# Patient Record
Sex: Female | Born: 1950 | Race: White | Hispanic: No | Smoking: Former smoker
Health system: Southern US, Community
[De-identification: ages and names within clinical notes are randomized; demographics above are authoritative.]

## PROBLEM LIST (undated history)

## (undated) DIAGNOSIS — C801 Malignant (primary) neoplasm, unspecified: Secondary | ICD-10-CM

## (undated) DIAGNOSIS — N059 Unspecified nephritic syndrome with unspecified morphologic changes: Secondary | ICD-10-CM

## (undated) DIAGNOSIS — D863 Sarcoidosis of skin: Secondary | ICD-10-CM

## (undated) DIAGNOSIS — J449 Chronic obstructive pulmonary disease, unspecified: Secondary | ICD-10-CM

## (undated) DIAGNOSIS — K219 Gastro-esophageal reflux disease without esophagitis: Secondary | ICD-10-CM

## (undated) DIAGNOSIS — D869 Sarcoidosis, unspecified: Secondary | ICD-10-CM

## (undated) DIAGNOSIS — F329 Major depressive disorder, single episode, unspecified: Secondary | ICD-10-CM

## (undated) DIAGNOSIS — R42 Dizziness and giddiness: Secondary | ICD-10-CM

## (undated) DIAGNOSIS — Z9889 Other specified postprocedural states: Secondary | ICD-10-CM

## (undated) DIAGNOSIS — M797 Fibromyalgia: Secondary | ICD-10-CM

## (undated) DIAGNOSIS — T451X5A Adverse effect of antineoplastic and immunosuppressive drugs, initial encounter: Secondary | ICD-10-CM

## (undated) DIAGNOSIS — F32A Depression, unspecified: Secondary | ICD-10-CM

## (undated) DIAGNOSIS — G8929 Other chronic pain: Secondary | ICD-10-CM

## (undated) DIAGNOSIS — R112 Nausea with vomiting, unspecified: Secondary | ICD-10-CM

## (undated) DIAGNOSIS — F419 Anxiety disorder, unspecified: Secondary | ICD-10-CM

## (undated) DIAGNOSIS — J189 Pneumonia, unspecified organism: Secondary | ICD-10-CM

## (undated) DIAGNOSIS — T8859XA Other complications of anesthesia, initial encounter: Secondary | ICD-10-CM

## (undated) DIAGNOSIS — IMO0002 Reserved for concepts with insufficient information to code with codable children: Secondary | ICD-10-CM

## (undated) DIAGNOSIS — Z86718 Personal history of other venous thrombosis and embolism: Secondary | ICD-10-CM

## (undated) DIAGNOSIS — H00019 Hordeolum externum unspecified eye, unspecified eyelid: Secondary | ICD-10-CM

## (undated) DIAGNOSIS — R410 Disorientation, unspecified: Secondary | ICD-10-CM

## (undated) DIAGNOSIS — J40 Bronchitis, not specified as acute or chronic: Secondary | ICD-10-CM

## (undated) DIAGNOSIS — D649 Anemia, unspecified: Secondary | ICD-10-CM

## (undated) DIAGNOSIS — M1990 Unspecified osteoarthritis, unspecified site: Secondary | ICD-10-CM

## (undated) DIAGNOSIS — Z5189 Encounter for other specified aftercare: Secondary | ICD-10-CM

## (undated) DIAGNOSIS — R0602 Shortness of breath: Secondary | ICD-10-CM

## (undated) DIAGNOSIS — R51 Headache: Secondary | ICD-10-CM

## (undated) DIAGNOSIS — T4145XA Adverse effect of unspecified anesthetic, initial encounter: Secondary | ICD-10-CM

## (undated) DIAGNOSIS — M549 Dorsalgia, unspecified: Secondary | ICD-10-CM

## (undated) DIAGNOSIS — N189 Chronic kidney disease, unspecified: Secondary | ICD-10-CM

## (undated) HISTORY — DX: Unspecified nephritic syndrome with unspecified morphologic changes: N05.9

## (undated) HISTORY — DX: Malignant (primary) neoplasm, unspecified: C80.1

## (undated) HISTORY — DX: Chronic obstructive pulmonary disease, unspecified: J44.9

## (undated) HISTORY — DX: Sarcoidosis of skin: D86.3

## (undated) HISTORY — DX: Gastro-esophageal reflux disease without esophagitis: K21.9

## (undated) HISTORY — PX: BLADDER SUSPENSION: SHX72

## (undated) HISTORY — PX: VULVA SURGERY: SHX837

## (undated) HISTORY — PX: ABDOMINAL HYSTERECTOMY: SHX81

## (undated) HISTORY — PX: BREAST SURGERY: SHX581

## (undated) HISTORY — PX: EPIDURAL BLOCK INJECTION: SHX1516

## (undated) HISTORY — PX: HERNIA REPAIR: SHX51

## (undated) HISTORY — PX: BONE MARROW BIOPSY: SHX199

## (undated) HISTORY — PX: TONSILLECTOMY: SUR1361

## (undated) HISTORY — PX: DILATION AND CURETTAGE OF UTERUS: SHX78

## (undated) HISTORY — PX: KNEE SURGERY: SHX244

## (undated) HISTORY — PX: THYROIDECTOMY, PARTIAL: SHX18

## (undated) HISTORY — PX: PORTACATH PLACEMENT: SHX2246

## (undated) HISTORY — PX: APPENDECTOMY: SHX54

## (undated) HISTORY — PX: CHOLECYSTECTOMY: SHX55

## (undated) HISTORY — PX: TUBAL LIGATION: SHX77

---

## 2000-10-05 ENCOUNTER — Encounter (HOSPITAL_COMMUNITY): Admission: RE | Admit: 2000-10-05 | Discharge: 2001-01-03 | Payer: Self-pay | Admitting: Hematology & Oncology

## 2000-10-05 ENCOUNTER — Encounter (INDEPENDENT_AMBULATORY_CARE_PROVIDER_SITE_OTHER): Payer: Self-pay | Admitting: Specialist

## 2000-11-01 ENCOUNTER — Encounter: Payer: Self-pay | Admitting: *Deleted

## 2000-11-01 ENCOUNTER — Encounter: Admission: RE | Admit: 2000-11-01 | Discharge: 2000-11-01 | Payer: Self-pay | Admitting: *Deleted

## 2000-11-19 ENCOUNTER — Encounter: Admission: RE | Admit: 2000-11-19 | Discharge: 2000-11-19 | Payer: Self-pay | Admitting: *Deleted

## 2000-11-19 ENCOUNTER — Encounter: Payer: Self-pay | Admitting: *Deleted

## 2001-07-15 ENCOUNTER — Encounter: Payer: Self-pay | Admitting: Surgery

## 2001-07-19 ENCOUNTER — Observation Stay (HOSPITAL_COMMUNITY): Admission: RE | Admit: 2001-07-19 | Discharge: 2001-07-20 | Payer: Self-pay | Admitting: Surgery

## 2001-07-19 ENCOUNTER — Encounter (INDEPENDENT_AMBULATORY_CARE_PROVIDER_SITE_OTHER): Payer: Self-pay | Admitting: Specialist

## 2001-07-23 ENCOUNTER — Encounter: Payer: Self-pay | Admitting: Surgery

## 2001-07-23 ENCOUNTER — Encounter: Admission: RE | Admit: 2001-07-23 | Discharge: 2001-07-23 | Payer: Self-pay | Admitting: Surgery

## 2002-02-16 ENCOUNTER — Inpatient Hospital Stay (HOSPITAL_COMMUNITY): Admission: AD | Admit: 2002-02-16 | Discharge: 2002-02-18 | Payer: Self-pay | Admitting: Pediatrics

## 2002-02-17 ENCOUNTER — Encounter: Payer: Self-pay | Admitting: Pediatrics

## 2003-06-13 DIAGNOSIS — Z86718 Personal history of other venous thrombosis and embolism: Secondary | ICD-10-CM

## 2003-06-13 HISTORY — DX: Personal history of other venous thrombosis and embolism: Z86.718

## 2004-04-28 ENCOUNTER — Ambulatory Visit: Payer: Self-pay | Admitting: Oncology

## 2004-06-23 ENCOUNTER — Ambulatory Visit: Payer: Self-pay | Admitting: Oncology

## 2004-08-18 ENCOUNTER — Ambulatory Visit: Payer: Self-pay | Admitting: Oncology

## 2004-10-13 ENCOUNTER — Ambulatory Visit: Payer: Self-pay | Admitting: Oncology

## 2005-02-03 ENCOUNTER — Ambulatory Visit: Payer: Self-pay | Admitting: Oncology

## 2005-03-27 ENCOUNTER — Ambulatory Visit: Payer: Self-pay | Admitting: Oncology

## 2005-06-09 ENCOUNTER — Ambulatory Visit: Payer: Self-pay | Admitting: Oncology

## 2005-09-01 ENCOUNTER — Ambulatory Visit: Payer: Self-pay | Admitting: Oncology

## 2005-10-02 ENCOUNTER — Ambulatory Visit: Payer: Self-pay | Admitting: Oncology

## 2006-01-04 ENCOUNTER — Ambulatory Visit: Payer: Self-pay | Admitting: Oncology

## 2006-03-02 ENCOUNTER — Ambulatory Visit: Payer: Self-pay | Admitting: Oncology

## 2006-06-27 ENCOUNTER — Ambulatory Visit: Payer: Self-pay | Admitting: Oncology

## 2006-09-19 ENCOUNTER — Ambulatory Visit: Payer: Self-pay | Admitting: Oncology

## 2006-12-12 ENCOUNTER — Ambulatory Visit: Payer: Self-pay | Admitting: Oncology

## 2007-03-06 ENCOUNTER — Ambulatory Visit: Payer: Self-pay | Admitting: Oncology

## 2009-02-05 ENCOUNTER — Encounter: Admission: RE | Admit: 2009-02-05 | Discharge: 2009-02-05 | Payer: Self-pay | Admitting: Neurosurgery

## 2010-10-28 NOTE — Procedures (Signed)
[facility]  Patient:    Elizabeth Kirby, Elizabeth Kirby                          MRN: [id number] Proc. Date: 10/05/00 Adm. Date:  [id number] Attending:  Jim Desanctis                           Procedure Report  PROCEDURE:  Left posterior iliac crest bone marrow biopsy aspirate.  SURGEON:  Rose Phi. Myna Hidalgo, M.D.  DESCRIPTION OF PROCEDURE:  The patient was brought to the outpatient unit at [facility].  She was placed on the monitor.  All the vital signs were stable.  Oxygen saturation was 99% on room air.  She was placed on the right side.  She was given a total of 10 mg of Versed IV.  The left posterior iliac crest region was prepped and draped in a sterile fashion.  Ten cubic centimeters of lidocaine was infiltrated under the skin and down to the periosteum.  A good biopsy and aspirate were obtained without difficulties. DD:  10/05/00 TD:  10/06/00 Job: [id number]

## 2010-10-28 NOTE — Discharge Summary (Signed)
Elizabeth Kirby, Elizabeth Kirby                             ACCOUNT NO.:  [id number]   MEDICAL RECORD NO.:  [id number]                   PATIENT TYPE:  INP   LOCATION:  3003                                 FACILITY:  [facility]   PHYSICIAN:  Deanna Artis. Sharene Skeans, M.D.           DATE OF BIRTH:  November 16, 1950   DATE OF ADMISSION:  02/16/2002  DATE OF DISCHARGE:  02/18/2002                                 DISCHARGE SUMMARY   FINAL DIAGNOSES:  1. Neck pain (723.1).  2. 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  3. Sarcoidosis (135).  4. Cervical spondylosis without cord compression or myelopathy.  5. Thrombophilia.   PROCEDURES:  1. MRI cervical and upper thoracic spine.  2. Lumbar puncture.   COMPLICATIONS:  None.   SUMMARY OF THE HOSPITALIZATION:  The patient was admitted to [facility] in transfer from [facility] with a 2-day history of pain in  her neck radiating up to the cranium cervical junction and down into her  upper back.  She had evidence of meningismus, mild underlying nausea, and  had normal temperature although she said that since her temperature is low  she felt that she was running a fever.  Indeed, during the hospitalization,  she had a temperature that rose to 99.   The patient was noted to have a white count of 12,000, 60% neutrophils,  hemoglobin 11.9, hematocrit 37, MCV 89, platelet count 1,080,000.  CT scan  of the brain was normal.  Cervical spine films were normal.   I examined the patient and there were no focal neurological deficits.  Following MRI scan which revealed evidence of a bulging disk at C6-7  impinging on the fecal sac without compression and mild disk protrusions at  C3-4, the contrast was given.  There was no evidence of abnormal enhancement  of the meninges or the spinal cord.  The bone marrow signal could be  compatible with sarcoid or normal finding.  The patient had possible  thoracic scoliosis.  None of these revealed an etiology for  dysfunction.  The normal cervical lordosis was lost suggesting muscle spasm.  Sedimentation rate of 9.   Lumbar puncture glucose 65, protein 33, red blood count 2, white blood count  1, occasional lymphs, monos, and neutrophils, cryptococcal antigen negative,  other tests are pending at this time including the culture.  Other  laboratories: Sodium 144, potassium 4.2, chloride 108, CO2 28, glucose 88,  BUN 15, creatinine 0.8, calcium 10.3, total protein 6.7, albumin 3.8, SGOT  20, SGPT 15, alkaline phosphatase 87, total bilirubin 0.5, prothrombin time  13.1, PTT 44, white blood cell count 11,800, hemoglobin 11.6, hematocrit  34.6, MCV 89, platelet count 967,000, 53 neutrophils, 37 lymphs, 6 monos, 4  eosinophils, absolute granulocyte count 6200.  Angiotensin-converting enzyme  is pending.  Pathologist review of the slides showed reactive  thrombocytosis.   CSF, VDRL and cryptococcal antigen are pending at this  time.   The patient was treated initially with naproxen, she escalated to IV Toradol  and IV Demerol following her lumbar puncture.  This morning she is  comfortable, her neck is somewhat more supple.  Her temperature is 97.2,  blood pressure 116/68, resting pulse 82, respirations 18, temperature 97.2,  pulse oximetry 97%.  There are no signs of infection of the head/neck.  Lungs clear.  Heart no murmurs.  Pulses normal.  Abdomen soft.  Extremities  are normal.  No Kernig's or Brudzinski's signs.  Neurologic examination:  Mental status, the patient was awake and alert.  Cranial nerves were normal.  Motor examination showed normal strength in all four limbs and good fine  motor movements; no pronator drift.  Sensation intact to cold, vibration,  and stereoagnosis.  Gait was not tested today but had been normal on  admission.  Cerebellar examination was normal without tremor, dystaxia,  dysmetria.  Deep tendon reflexes were diminished.  The patient had bilateral  flexor plantar  responses.   The patient was seen by Dr. Evelene Croon of [facility] at my request.  He  recommended placing the patient on Agrylin an antiplatelet medication and to  have her follow up with Dr. Gilman Buttner in 1-2 weeks for further blood work  (platelet count).  We will send her home with Agrylin 0.5 mg three times a  day, Skelaxin 400 mg two p.o. t.i.d. up to 2 weeks, OxyContin 20 mg one  b.i.d. as needed for pain up to 5 days, thereafter naproxen 500 mg one  t.i.d. as needed for pain.  She also takes Os-Cal 500 mg per day, folic acid  1 mg per day, vitamin E 400 international units per day.   DISCHARGE INSTRUCTIONS:  She will have a regular diet, activity as  tolerated, and we will place her in a soft collar to see if we can rest her  neck.  She does not need a followup at [facility] unless  there are further complications.  She should follow up with her local doctor  for any further pain management.  I believe this to be a viral syndrome that  has caused meningismus.  Reason for this is certainly unclear.  We have  ruled out other serious causes of neck pain.                                               Deanna Artis. Sharene Skeans, M.D.    [facility]  D:  02/18/2002  T:  02/19/2002  Job:  [id number]   cc:   Dellia Beckwith, M.D.  [address]  [place]  [place] [address]  Fax: [phone]   [facility]

## 2010-10-28 NOTE — Op Note (Signed)
[facility]  Patient:    Elizabeth Kirby, Elizabeth Kirby Visit Number: [id number] MRN: [id number]          Service Type: SUR Location: 4W 0450 01 Attending Physician:  Bonnetta Barry Dictated by:   Velora Heckler, M.D. Proc. Date: 07/19/01 Admit Date:  07/19/2001   CC:         Jonelle Sports. Cheryll Cockayne, M.D.  Wynelle Bourgeois, M.D., [place], [place].  Dellia Beckwith, M.D.   Operative Report  PREOPERATIVE DIAGNOSIS:  Right thyroid nodule.  POSTOPERATIVE DIAGNOSIS:  Right thyroid nodule.  PROCEDURE:  Right thyroid lobectomy and isthmusectomy.  SURGEON:  Velora Heckler, M.D.  ASSISTANT:  Angelia Mould. Derrell Lolling, M.D.  ANESTHESIA:  General.  ESTIMATED BLOOD LOSS:  Minimal  PREPARATION:  Betadine.  COMPLICATIONS:  None.  INDICATIONS FOR PROCEDURE:  The patient is a 60 year old white female who presents with newly diagnosed thyroid nodule. This was found on routine physical exam. A nuclear medicine scan showed this to be a cold nodule. A thyroid ultrasound was performed which showed a solid nodule measuring 1.6 x 1.8 cm in diameter. 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 The patient now presents for thyroid lobectomy.  DESCRIPTION OF PROCEDURE:  The procedure was done in OR #6 at the [facility]. The patient was brought to the operating room, placed in the supine position in the operating room table. Following the administration of general anesthesia, the patient is positioned and then prepped and draped in the usual strict aseptic fashion. After ascertaining that an adequate level of anesthesia had been obtained, a Kocher incision is made with a #10 blade. Dissection is carried down through the subcutaneous tissues and platysma. Hemostasis is obtained with the electrocautery. Skin flaps are developed cephalad and caudad from the  thyroid notch to the sternal notch. A Mahorner self retaining retractor is placed for exposure. Strap muscles are incised in the midline. The left side strap muscles are reflected laterally in order to provide access to the left thyroid lobe. This lobe appears grossly normal. There is slight thickening in the inferior pole but no discreet nodular mass. Next, we turned our attention to the right side. Strap muscles are again reflected laterally. There is some adhesions between the capsule of the gland and the strap muscles probably due to previous fine needle aspiration. The gland is mobilized. The superior pole vessels are ligated in continuity right 2-0 silk ties and divided. Venous tributaries to the superior pole are divided between medium LigaClips. The gland is rolled anteriorly. The middle thyroid vein is divided between LigaClips. The branches of the inferior thyroid artery are divided between small LigaClips. The inferior pole is mobilized and inferior thyroid veins divided between medium LigaClips. The gland is rolled up and onto the anterior surface of the trachea. Recurrent laryngeal nerve is identified and preserved. Parathyroid tissue is identified and preserved. Ligament of Allyson Sabal is divided and then the gland is freed up onto the anterior surface of the trachea. Isthmus is mobilized. There is a moderate size pyramidal lobe which is excised using the electrocautery for hemostasis. Dissection is carried over to the anterior section of the left lobe. The gland is divided between hemostats and submitted for frozen section. The left lobe is suture ligated with 3-0 Vicryl suture ligatures. Good hemostasis is noted. Dr. Laureen Ochs reviewed the frozen section and feels this is indeed a follicular lesion. He  sees no malignant features. He will defer a final opinion for permanent sections. Good hemostasis was noted in the right neck. A sheet of Surgicel is placed over the area of the recurrent  laryngeal nerve. Strap muscles are reapproximated in the midline with interrupted 3-0 Vicryl sutures. The platysma is reapproximated with interrupted 3-0 Vicryl sutures. The skin edges are reapproximated with widely spaced stainless steel staples and interspaced half inch Steri-Strips and Benzoin. Sterile dressings are applied. The patient is awakened from anesthesia and brought to the recovery room in stable condition. The patient tolerated the procedure well. Dictated by:   Velora Heckler, M.D. Attending Physician:  Bonnetta Barry DD:  07/19/01 TD:  07/19/01 Job: [id number]

## 2010-10-28 NOTE — Consult Note (Signed)
Elizabeth Kirby, Elizabeth Kirby                             ACCOUNT NO.:  [id number]   MEDICAL RECORD NO.:  [id number]                   PATIENT TYPE:  INP   LOCATION:  3003                                 FACILITY:  [facility]   PHYSICIAN:  Enzo Montgomery. Filbert Berthold, M.D.               DATE OF BIRTH:  18-Jul-1950   DATE OF CONSULTATION:  02/17/2002  DATE OF DISCHARGE:                                   CONSULTATION   REASON FOR CONSULTATION:  This 60 year old female with a history of  essential thrombocytosis.  The patient has been followed by Dr. Maebelle Munroe in [place] since she was diagnosed in April 2002, with an elevated  platelet count, perhaps related to sarcoidosis.  She was initially started  on Hydrea for approximately two weeks with a good response in her platelet  count; however, the patient did not tolerate the medication secondary to  nausea, vomiting, and fatigue.  The patient felt strongly about not taking  any medications for her high platelet count and has been followed on  naturopathic kind of medications since that time, with platelets running in  the 700,000-900,000 range.  She had a bone marrow biopsy done on October 05, 2000, by Dr. Rose Phi. Ennever that showed a high platelet count, but no  other abnormalities in the trilineage, and it was thought perhaps to be a  reactive thrombocytosis as well as the presence of a granulomata within the  specimen.  She was admitted to the hospital yesterday when she presented with a one-day  history of severe neck pain that she describes as being very stiff.  She had  some mild photophobia but no headaches, fevers, chills, or other neurologic  symptoms.  After being admitted to the neurology service, she had an MRI of her head  and her neck which were unremarkable.  She had an LP done this morning, with  the results pending.   PAST MEDICAL HISTORY:  1. 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  2. 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   MEDICATIONS:  None prior to admission.   ALLERGIES:  Multiple allergies, including PENICILLIN, SULFA, AND IV DYE.   SOCIAL HISTORY:  Habits:  She quit smoking cigarettes two years ago.  She  does not drink.  No IV drug use.  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   FAMILY HISTORY:  Her mother has bladder cancer, but there are no other  hematologic malignancies that run through the family.   PHYSICAL EXAMINATION:  GENERAL:  She is status post LP, and somewhat  uncomfortable from that.  VITAL SIGNS:  She has been afebrile.  Her vital signs are normal.  NEUROLOGIC:  Her cranial nerves are intact.  Her neurological exam is  otherwise nonfocal.  HEENT:  She has  no oral lesions.  LUNGS:  Clear to auscultation.  HEART:  Normal S1, S2.  ABDOMEN:  Soft, nontender, nondistended, with no hepatosplenomegaly.  EXTREMITIES:  There is no peripheral edema.  There is no rash.  No joint  effusions.   LABORATORY DATA:  The blood work from today reveals a white count of 11.8,  hematocrit 35, platelets 967,000.  She has a CMET that is normal.  MRI of the head and neck are normal per the report of the neurology service.   IMPRESSION:  A 60 year old female with essential thrombocytosis, which may  or may not be related to sarcoidosis.  She was tried on Hydrea initially  with a good response in her platelets, but she did not tolerate the  medication.  She said she did not want to take Hydrea and/or other  treatments that may be used for sarcoidosis, such as steroids.  She presents  with meningismus and no other findings at this time, and I think that the  high platelet count is perhaps unrelated to her symptoms.  She may have an  aseptic meningitis, as this is now the season for that, or may have just had  serious muscle strain.   RECOMMENDATIONS:  I think it would be reasonable to start her on anagrelide  at 0.5  mg p.o. t.i.d., start bringing down her platelet count.  This  medication tends to be better tolerated than Hydrea, and is more specific to  platelets, and does not effect the other lineages as much.  It can be  associated with some palpitations and blood pressure abnormalities, but  generally these are manageable.  I stressed to the patient that she needs a  close followup with Dr. Melida Quitter after discharge, and should be seen in the  office within one to two weeks for repeat blood work, and she promises that  she will make that appointment.  (Of note, she has missed appointments with  Dr. Melida Quitter in the past.)   PLAN:  1. Start anagrelide at 0.5 mg p.o. t.i.d. while in the hospital, to start     lowering her platelet count.  2. She will contact Dr. Evans Lance office for followup within one to two     weeks from discharge, assuming that there are no other findings on this     hospital admission.                                                 Robert C. Filbert Berthold, M.D.    [facility]  D:  02/17/2002  T:  02/18/2002  Job:  [id number]   cc:   Deanna Artis. Sharene Skeans, M.D.  [address]  [place]  [place] [address]  Fax: [phone]   Maebelle Munroe, M.D.  [place], [place]

## 2010-10-28 NOTE — H&P (Signed)
NAMESILVA, AAMODT                             ACCOUNT NO.:  [id number]   MEDICAL RECORD NO.:  [id number]                   PATIENT TYPE:  INP   LOCATION:  3003                                 FACILITY:  [facility]   PHYSICIAN:  Deanna Artis. Sharene Skeans, M.D.           DATE OF BIRTH:  [birth year]   DATE OF ADMISSION:  02/16/2002  DATE OF DISCHARGE:                                HISTORY & PHYSICAL   HISTORY OF PRESENT ILLNESS:  The patient is a 60 year old right-handed  married Caucasian woman with the diagnosis of sarcoidosis and thrombophilia.  She presents with a two-day history of neck pain which has worsened over the  past day.  This radiates into the upper back.  She has meningismus, no  headache, fever, or other signs of infection.  She has had underlying  nausea.  The patient states that her resting temperature is usually 95 and  she is now running temperatures of 98.  The patient has had some  nightsweats for years and blamed it on menopause.  No thrombotic events have  occurred.   The patient was seen earlier in the day at [facility] emergency room.  Plain films of the neck failed to show abnormalities other than  straightening of the spine.  Cranial CT scan without contrast was normal.  Platelet count 1,080,000, white blood cell count 12,000, 60% polies,  hemoglobin 11.9, hematocrit 37, MCV 89.   REVIEW OF SYSTEMS:  The patient has had no recurrent infections in the head,  neck, GI, or GU.  No arthritis.  No whiplash, falls, or injuries to her  neck.  No chest pain or shortness of breath.  No vomiting, diarrhea, or  dysuria.  No tick bites.  The patient has had nausea and has had a rash that  was on her wrist and is now on her buttock.  It is not clear to me that it  is the same rash.  NEUROLOGY:  Normal mental status, other than she is a bit irritable because  of the pain.  No seizures, loss of consciousness, diplopia, dysarthria,  dysphagia, tenderness, syncope, vertigo,  weakness, numbness, tingling, or  loss of bowel and bladder control. The patient has had a history of  migraines and says that her symptoms are not like migraines.   PAST MEDICAL HISTORY:  Diagnosis of sarcoidosis two to three years ago by  Rose Phi. Myna Hidalgo, M.D., followed by Dellia Beckwith, M.D. in [place].  The patient has received hydroxyurea in the past, but has had no significant  therapy that I am aware of since then.   PAST SURGICAL HISTORY:  Thyroid nodule removed, hysterectomy, later  oophorectomy secondary to endometriosis, cholecystectomy, appendectomy,  tonsillectomy, and patella surgery for dislocated patella caused by trauma.   MEDICATIONS:  Vitamin C, E, and folic acid.   ALLERGIES:  PENICILLIN, SULFA, IODINE, LATEX.   FAMILY HISTORY:  Mother is alive,  obese, and has ulcers.  Father has had  bladder cancer which was removed, atherosclerotic cardiovascular disease  treated with CABG, and COPD.   SOCIAL HISTORY:  The patient works in a warehouse.  She is a nonsmoker, she  does not drink alcohol. She is married.   PHYSICAL EXAMINATION:  VITAL SIGNS:  Blood pressure 118/61, resting pulse  64, respirations 16, temperature 98.3, pulse oximetry 95%.  HEENT:  No infections.  NECK:  Stiff, I can move her head from side to side, but I cannot either  extend or flex her neck.  No bruits.  LUNGS:  Clear.  HEART:  No murmurs, pulses normal.  ABDOMEN:  Soft, bowel sounds normal.  No hepatosplenomegaly.  EXTREMITIES:  Normal.  SKIN:  Heat rash on the buttocks.  The patient did not have significant  lymphadenopathy.  MENTAL STATUS EXAM:  Awake, alert, attentive, appropriate, no dysphagia or  dyspraxia.  Normal fund of knowledge.  No problems with recent or remote  memory.  Cranial nerves; round, reactive pupils, normal fundi, visual fields full to  double simultaneous stimuli, OKN responses equal, symmetric facial strength,  midline tongue and uvula, air conduction  greater than bone conduction  bilaterally.  Motor examination; Normal strength, tone, and mass.  Good fine motor  movements. No pronator drift.  Sensation intact to cold, vibration, and  stereognosis.  Cerebella examination; good finger-to-nose rapid repetitive  movements without dystaxia.  Gait and station were normal.  The patient  could walk on the heels and toes and perform tandem.  Deep tendon reflexes  are normal to diminished.  The toes are bilaterally flexor.   IMPRESSION:  1. Neck pain, 723.1.  2. Meningismus without meningitis.  781.6.  3. Sarcoidosis.  135.  4. Thrombophilia.   PLAN:  The patient needs to have an MRI of the cervical spine and mid to  upper thoracic spine without and with contrast.  Failing to find anything  there, we may need to carry out lumbar puncture.  The patient will be  treated tonight with muscle relaxants and nonsteroid anti-inflammatory  agents.  I have contacted [facility] and asked Dr. Myna Hidalgo to  reassess the patient in light of current symptoms and the patient's previous  diagnosis.                                               Deanna Artis. Sharene Skeans, M.D.    [facility]  D:  02/16/2002  T:  02/17/2002  Job:  [id number]

## 2011-02-20 IMAGING — CT CT L SPINE W/ CM
4 of 8 series · 13 of 33 positions shown, 15 images · non-contrast
Comparison: none

CLINICAL DATA: Low back pain radiating across the back.  The
patient has no significant radicular symptoms.  She rates her pain
on average as 7-8/10.  She took some pain medication prior to the
exam.  Sitting in the nurses station, she rates her pain as [DATE].
MRI performed at [facility] demonstrates disc disease at L4-
5 and L5-S1.
TECHNIQUE: CT imaging of the lumbar spine was performed after
intradiskal contrast administration.  Multiplanar CT image
reconstructions were also generated.

[Series 2: l spine · axial · 0.27mm/px · z∈[-155,-55]mm · 4 of 65 slices shown, 5 images]
[im 13/65  soft-tissue]
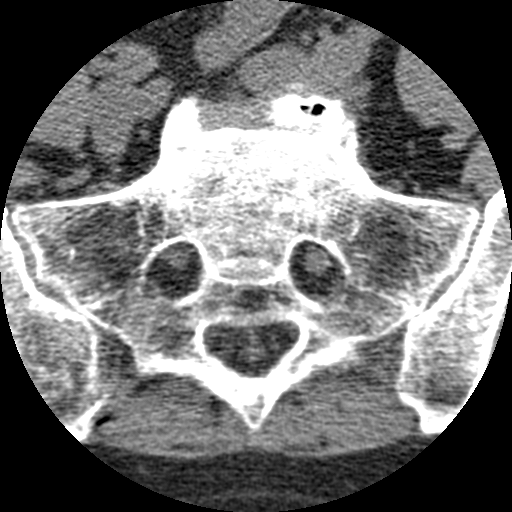
[im 13/65  bone]
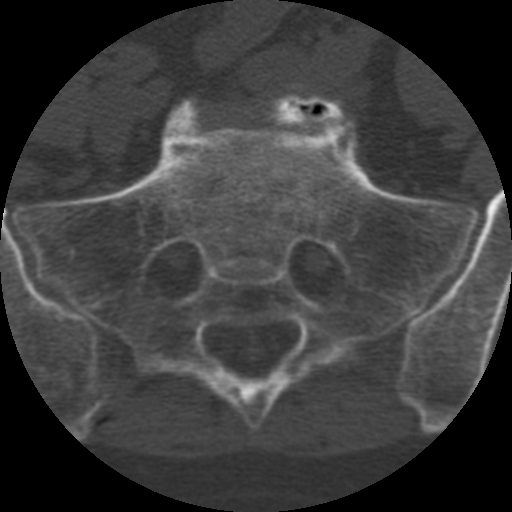
[im 26/65  bone]
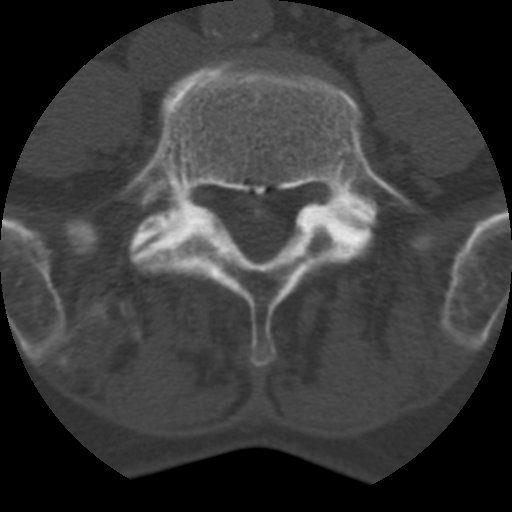
[im 39/65  bone]
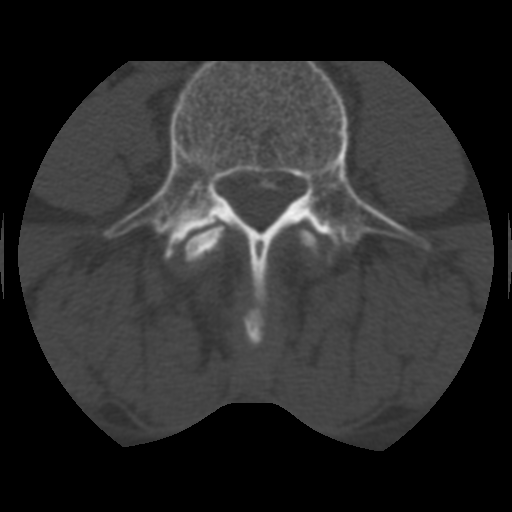
[im 52/65  bone]
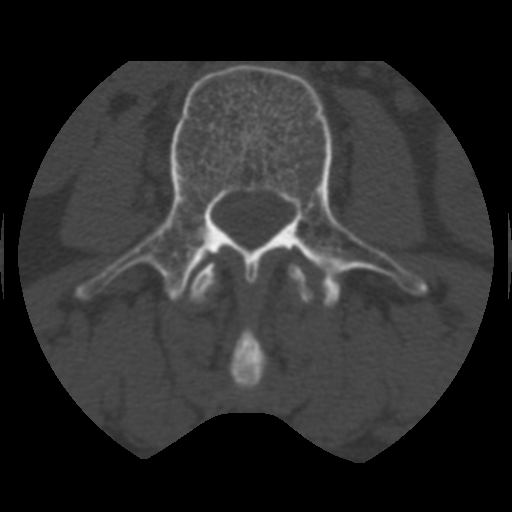

[Series 3: bone windows · axial · 0.27mm/px · z∈[-145,-65]mm · 3 of 66 slices shown]
[im 17/66  bone]
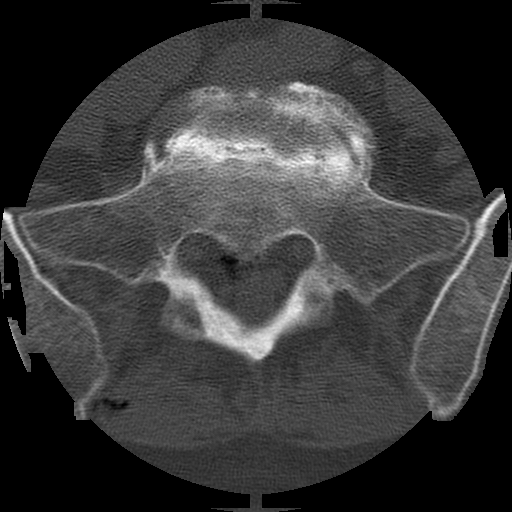
[im 33/66  bone]
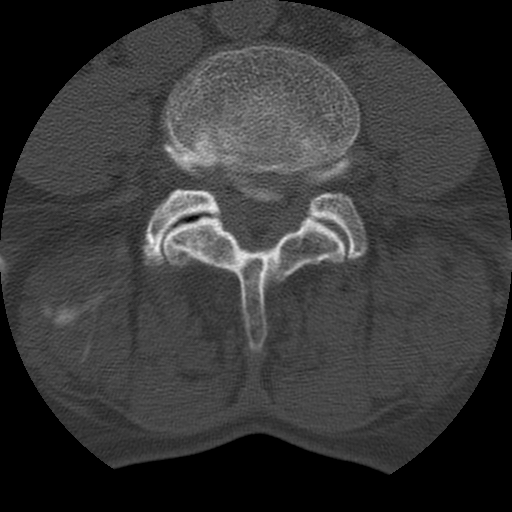
[im 49/66  bone]
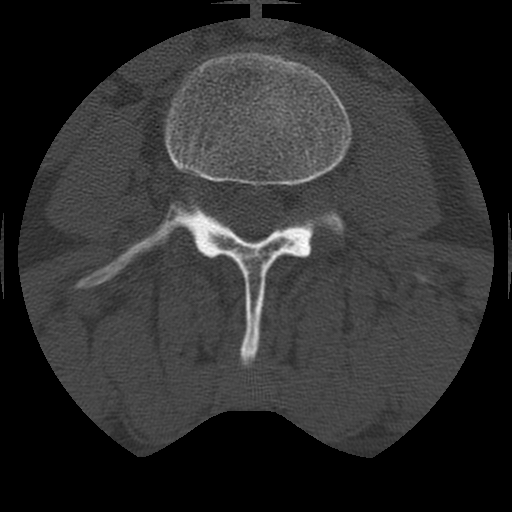

[Series 400: cor lower l-spine · coronal · 0.32mm/px · 1 of 40 slices shown]
[im 20/40  bone]
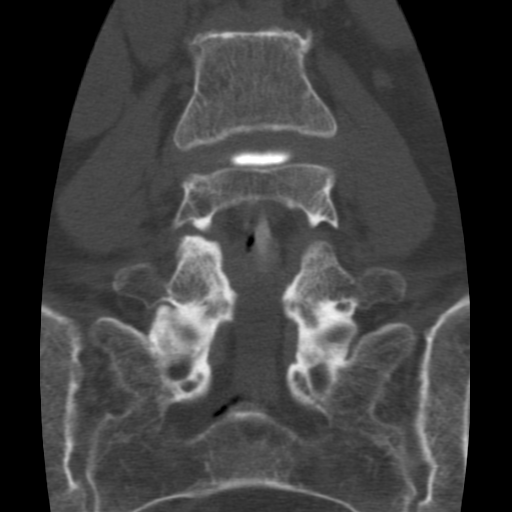

[Series 402: sag l-spine · sagittal · 0.32mm/px · 5 of 40 slices shown, 6 images]
[im 14/40  bone]
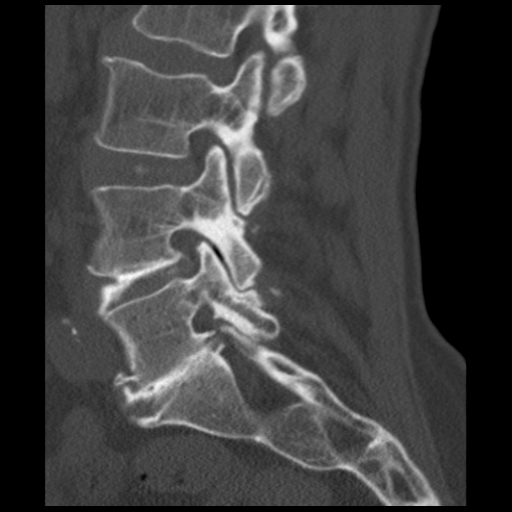
[im 17/40  bone]
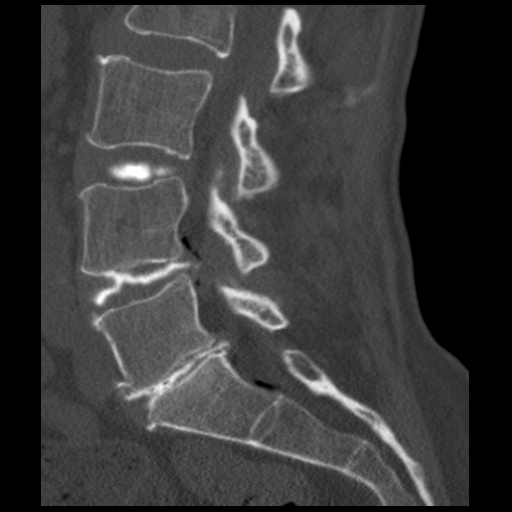
[im 20/40  soft-tissue]
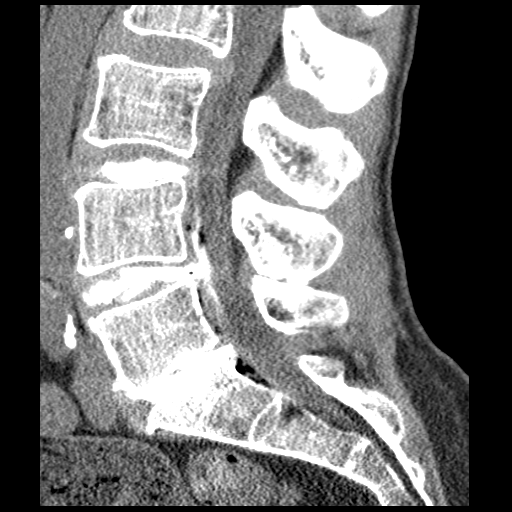
[im 20/40  bone]
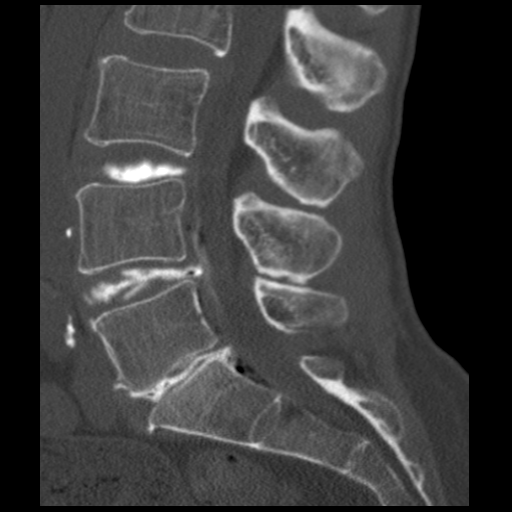
[im 23/40  bone]
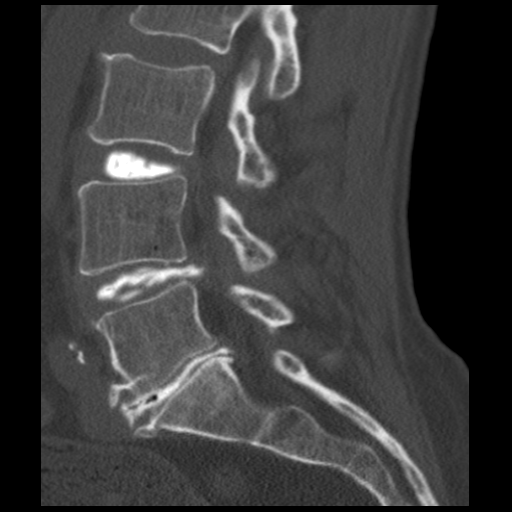
[im 27/40  bone]
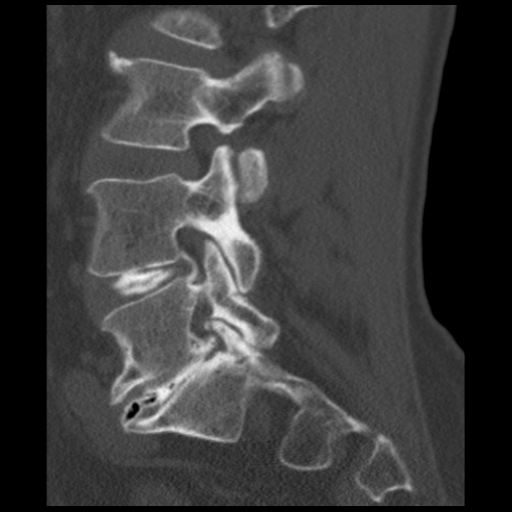

[13 of 33 positions shown; findings below may reference images not displayed]

Fluoroscopy Time: 5 minutes 45 seconds

DIAGNOSTIC LUMBAR DISK INJECTIONS

The procedure was discussed in depth with the patient including the
potential risk of infection. The patient is received 1 gram of
Ancef intravenously prior to the procedure with a small amount
withdrawn and added to the contrast for injection.  She received 2
mg of Versed intravenously prior to the procedure.  She was placed
prone on the fluoroscopic table.  A Betadine scrub of the low back
was performed and the patient was draped in a sterile fashion.
Skin anesthesia was carried [DATE]% Lidocaine. 15 cm 22 gauge
Aury Barcenas were directed into the nuclear regions of the disks
at L3-4 and L4-5.  I was unable to access the disc spaces at L5-S1
with a 20 cm 22 gauge Chiba needle.  I therefore used an 18 gauge
spinal needle and inserted a curved 22 gauge Chiba needle in a
coaxial fashion.  Omnipaque 180 mixed was injected using a pressure
monitoring syringe.  Spot radiographs were taken. The patient's
symptoms were recorded.  Following the procedure, she/he was
treated with intravenous Toradol and Fentanyl.  The patient is was
taken to CT scan in good condition.  She was observed for an hour
after the CT scan and discharged in good condition.
FINDINGS: Following the placement of all three needles, the
patient stated her pain was [DATE].  It was somewhat typical of her
usual pain.  She did have radicular pain with placement of the L5 s
one nerve root on the right.  This resolved with repositioning of
the needle.

L3-4:  The opening pressure was 10 PSI.  The patient had pain at 20
PSI.  A total of 2 ml of contrast was utilized.  She rated her pain
at 9.5/10.  She describes this as pain and burning.  It was typical
of her usual pain. Imaging demonstrated contrast contained
primarily within the central nucleus.  There is posterior extension
of contrast to the posterior margin of the disc.  No definite
epidural leak was identified.

L4-5:  The opening pressure was 10 PSI.  She complained of pain at
20 PSI.  A total of 4 ml of contrast was utilized.  She rated her
pain is [DATE].  This was typical of her usual pain but not
as severe as L3-4 or L5-S1.  Imaging demonstrates diffuse spread of
contrast compatible with degeneration of the disc.  There is
extension posteriorly to the epidural space without definite
epidural extension.

L5-S1:  The opening pressure was 10 PSI.  The patient had pain at
the opening pressure.  A total of 3 ml of contrast was utilized.
She rated her pain is [DATE].  This is typical of her usual pain.  A
maximum pressure of 20 PSI was achieved.  Imaging demonstrates
diffuse degeneration of the disc with extension of contrast to the
periphery of the disc in all planes.  There may be some epidural
leak of contrast.
IMPRESSION: 1.  Chemo sensitive pain response to injection of disc levels at L3-
4, L4-5, and L5-S1 is equivalent.  The patient describes this as
concordant pain at each level including the control level, L3-4.
2.  Diffuse degeneration of the L4-5 disc with extension of
contrast to the posterior margin of the disc.
3.  Contrast is contained within the central nucleus apart from a
central posterior extension at L3-4.
4.  Diffuse degeneration of the L5-S1 disc with extension to the
periphery of the disc in all planes.
5.  There is probable epidural leak of contrast at the L5-S1 level.

CT DISCOGRAPHY LUMBAR SPINE
FINDINGS: The lumbar spine is imaged from the midbody of L2-S3.
There is slight retrolisthesis at L2-3.  Vertebral body heights are
maintained.  Alignment is otherwise anatomic.  Individual disc
levels are as follows.

L2-3:  There is mild uncovering of the disc and mild lateral recess
narrowing, left greater than right.  Mild facet hypertrophy
contributes.  The foramina appear patent bilaterally.

L3-4:  The nucleus is expanded posteriorly.  There is epidural
contrast at this level which appears to be tracking from below.
There is no definite complete tear of the disc.  The nucleus is
otherwise intact.  There is mild broad-based disc bulging with mild
central canal narrowing.  Facet hypertrophy and ligamentum flavum
thickening contribute.  There is no significant to foraminal
stenosis.

L4-5:  There is diffuse spread of contrast throughout the annulus,
more so right than left.  There is extension posteriorly through
the annulus to the epidural space.  Broad-based disc bulging is
present.  There is moderate central canal stenosis.  Facet
hypertrophy and ligamentum flavum thickening contribute.  Mild
foraminal narrowing is seen bilaterally.

L5-S1:  There is chronic loss of disc height.  Broad-based disc
bulging is present.  There is diffuse spread of contrast throughout
the disc space compatible with annular degeneration.  Contrast
extends to the posterior margin of the disc.  Facet hypertrophy is
worse left than right.  There is lateral recess narrowing, left
greater than right.  Moderate foraminal narrowing is present on the
left, mild on the right, due to osteophyte formation and facet
spurring.
IMPRESSION: 1.  Diffuse disc degeneration at L4-5 and L5-S1 with contrast
spreading throughout the annular portions of the disc and to the
posterior margin.  There is apparent epidural leak of contrast at
L4-5.
2.  Posterior annular tear at L3-4 without extension of contrast to
the epidural space.
3.  Moderate central canal stenosis at L4-5.
4.  Mild foraminal narrowing bilaterally at L4-5.
5.  Mild lateral recess narrowing, left worse than right, at L5-S1.
6.  Moderate left and mild right foraminal narrowing at L5-S1 due
to facet spurring.

## 2011-04-26 ENCOUNTER — Other Ambulatory Visit: Payer: Self-pay | Admitting: Neurosurgery

## 2011-05-05 ENCOUNTER — Encounter (HOSPITAL_COMMUNITY): Payer: Self-pay | Admitting: Pharmacy Technician

## 2011-05-15 ENCOUNTER — Encounter (HOSPITAL_COMMUNITY)
Admission: RE | Admit: 2011-05-15 | Discharge: 2011-05-15 | Disposition: A | Discharge status: Home / Self Care | Payer: Medicaid Other | Source: Ambulatory Visit | Attending: Neurosurgery | Admitting: Neurosurgery

## 2011-05-15 ENCOUNTER — Encounter (HOSPITAL_COMMUNITY): Payer: Self-pay

## 2011-05-15 HISTORY — DX: Unspecified osteoarthritis, unspecified site: M19.90

## 2011-05-15 HISTORY — DX: Anemia, unspecified: D64.9

## 2011-05-15 HISTORY — DX: Dorsalgia, unspecified: M54.9

## 2011-05-15 HISTORY — DX: Other chronic pain: G89.29

## 2011-05-15 HISTORY — DX: Encounter for other specified aftercare: Z51.89

## 2011-05-15 HISTORY — DX: Adverse effect of unspecified anesthetic, initial encounter: T41.45XA

## 2011-05-15 HISTORY — DX: Malignant (primary) neoplasm, unspecified: C80.1

## 2011-05-15 HISTORY — DX: Reserved for concepts with insufficient information to code with codable children: IMO0002

## 2011-05-15 HISTORY — DX: Major depressive disorder, single episode, unspecified: F32.9

## 2011-05-15 HISTORY — DX: Chronic kidney disease, unspecified: N18.9

## 2011-05-15 HISTORY — DX: Other specified postprocedural states: R11.2

## 2011-05-15 HISTORY — DX: Sarcoidosis, unspecified: D86.9

## 2011-05-15 HISTORY — DX: Anxiety disorder, unspecified: F41.9

## 2011-05-15 HISTORY — DX: Other complications of anesthesia, initial encounter: T88.59XA

## 2011-05-15 HISTORY — DX: Bronchitis, not specified as acute or chronic: J40

## 2011-05-15 HISTORY — DX: Gastro-esophageal reflux disease without esophagitis: K21.9

## 2011-05-15 HISTORY — DX: Other specified postprocedural states: Z98.890

## 2011-05-15 HISTORY — DX: Depression, unspecified: F32.A

## 2011-05-15 HISTORY — DX: Headache: R51

## 2011-05-15 HISTORY — DX: Chronic obstructive pulmonary disease, unspecified: J44.9

## 2011-05-15 HISTORY — DX: Fibromyalgia: M79.7

## 2011-05-15 HISTORY — DX: Pneumonia, unspecified organism: J18.9

## 2011-05-15 HISTORY — DX: Hordeolum externum unspecified eye, unspecified eyelid: H00.019

## 2011-05-15 LAB — URINALYSIS, ROUTINE W REFLEX MICROSCOPIC
Bilirubin Urine: NEGATIVE
Hgb urine dipstick: NEGATIVE
Protein, ur: NEGATIVE mg/dL
Specific Gravity, Urine: 1.01 (ref 1.005–1.030)
Urobilinogen, UA: 0.2 mg/dL (ref 0.0–1.0)

## 2011-05-15 LAB — DIFFERENTIAL
Basophils Absolute: 0 10*3/uL (ref 0.0–0.1)
Basophils Relative: 0 % (ref 0–1)
Eosinophils Absolute: 0.2 10*3/uL (ref 0.0–0.7)
Eosinophils Relative: 3 % (ref 0–5)

## 2011-05-15 LAB — BASIC METABOLIC PANEL
CO2: 27 mEq/L (ref 19–32)
Calcium: 10.1 mg/dL (ref 8.4–10.5)
Creatinine, Ser: 0.74 mg/dL (ref 0.50–1.10)
Glucose, Bld: 99 mg/dL (ref 70–99)

## 2011-05-15 LAB — CBC
MCH: 31.6 pg (ref 26.0–34.0)
MCHC: 32 g/dL (ref 30.0–36.0)
MCV: 98.7 fL (ref 78.0–100.0)
Platelets: 422 10*3/uL — ABNORMAL HIGH (ref 150–400)
RDW: 15.1 % (ref 11.5–15.5)

## 2011-05-15 LAB — PROTIME-INR: Prothrombin Time: 13.8 seconds (ref 11.6–15.2)

## 2011-05-15 LAB — SURGICAL PCR SCREEN: Staphylococcus aureus: NEGATIVE

## 2011-05-15 NOTE — Progress Notes (Signed)
Requesting EKG/CXR from [facility], patient had recent surgery done August 2012.

## 2011-05-15 NOTE — Pre-Procedure Instructions (Signed)
20 Elizabeth Kirby  05/15/2011   Your procedure is scheduled on:  Thursday May 18, 2011  Report to [facility] Short Stay Center at 0530 AM.  Call this number if you have problems the morning of surgery: [phone]   Remember:   Do not eat food:After Midnight.  May have clear liquids: up to 4 Hours before arrival.  Clear liquids include soda, tea, black coffee, apple or grape juice, broth.  Take these medicines the morning of surgery with A SIP OF WATER: albuterol, combivent, symbicort, dronabinol, keppra, tizanidine, morphine   Do not wear jewelry, make-up or nail polish.  Do not wear lotions, powders, or perfumes. You may wear deodorant.  Do not shave 48 hours prior to surgery.  Do not bring valuables to the hospital.  Contacts, dentures or bridgework may not be worn into surgery.  Leave suitcase in the car. After surgery it may be brought to your room.  For patients admitted to the hospital, checkout time is 11:00 AM the day of discharge.   Patients discharged the day of surgery will not be allowed to drive home.  Name and phone number of your driver: Simonne Martinet [phone]  Special Instructions: CHG Shower Use Special Wash: 1/2 bottle night before surgery and 1/2 bottle morning of surgery.   Please read over the following fact sheets that you were given: Pain Booklet, Coughing and Deep Breathing, Blood Transfusion Information, MRSA Information and Surgical Site Infection Prevention

## 2011-05-17 MED ORDER — VANCOMYCIN HCL IN DEXTROSE 1-5 GM/200ML-% IV SOLN
1000.0000 mg | INTRAVENOUS | Status: AC
  Administered 2011-05-18: 1000 mg via INTRAVENOUS
  Filled 2011-05-17: qty 200

## 2011-05-18 ENCOUNTER — Encounter (HOSPITAL_COMMUNITY)
Admission: RE | Disposition: A | Discharge status: Home / Self Care | Payer: Self-pay | Source: Ambulatory Visit | Attending: Neurosurgery

## 2011-05-18 ENCOUNTER — Encounter (HOSPITAL_COMMUNITY): Payer: Self-pay | Admitting: *Deleted

## 2011-05-18 ENCOUNTER — Ambulatory Visit (HOSPITAL_COMMUNITY): Payer: Medicaid Other | Admitting: *Deleted

## 2011-05-18 ENCOUNTER — Ambulatory Visit (HOSPITAL_COMMUNITY): Payer: Medicaid Other

## 2011-05-18 ENCOUNTER — Other Ambulatory Visit: Payer: Self-pay | Admitting: Neurosurgery

## 2011-05-18 ENCOUNTER — Observation Stay (HOSPITAL_COMMUNITY)
Admission: RE | Admit: 2011-05-18 | Discharge: 2011-05-18 | Disposition: A | Discharge status: Home / Self Care | Payer: Medicaid Other | Source: Ambulatory Visit | Attending: Neurosurgery | Admitting: Neurosurgery

## 2011-05-18 DIAGNOSIS — F329 Major depressive disorder, single episode, unspecified: Secondary | ICD-10-CM | POA: Insufficient documentation

## 2011-05-18 DIAGNOSIS — M48061 Spinal stenosis, lumbar region without neurogenic claudication: Secondary | ICD-10-CM

## 2011-05-18 DIAGNOSIS — R51 Headache: Secondary | ICD-10-CM | POA: Insufficient documentation

## 2011-05-18 DIAGNOSIS — M47817 Spondylosis without myelopathy or radiculopathy, lumbosacral region: Principal | ICD-10-CM | POA: Insufficient documentation

## 2011-05-18 DIAGNOSIS — K219 Gastro-esophageal reflux disease without esophagitis: Secondary | ICD-10-CM | POA: Insufficient documentation

## 2011-05-18 DIAGNOSIS — M799 Soft tissue disorder, unspecified: Secondary | ICD-10-CM | POA: Insufficient documentation

## 2011-05-18 DIAGNOSIS — IMO0001 Reserved for inherently not codable concepts without codable children: Secondary | ICD-10-CM | POA: Insufficient documentation

## 2011-05-18 DIAGNOSIS — Z01812 Encounter for preprocedural laboratory examination: Secondary | ICD-10-CM | POA: Insufficient documentation

## 2011-05-18 DIAGNOSIS — D869 Sarcoidosis, unspecified: Secondary | ICD-10-CM | POA: Insufficient documentation

## 2011-05-18 DIAGNOSIS — J449 Chronic obstructive pulmonary disease, unspecified: Secondary | ICD-10-CM | POA: Insufficient documentation

## 2011-05-18 DIAGNOSIS — J4489 Other specified chronic obstructive pulmonary disease: Secondary | ICD-10-CM | POA: Insufficient documentation

## 2011-05-18 DIAGNOSIS — F411 Generalized anxiety disorder: Secondary | ICD-10-CM | POA: Insufficient documentation

## 2011-05-18 DIAGNOSIS — F3289 Other specified depressive episodes: Secondary | ICD-10-CM | POA: Insufficient documentation

## 2011-05-18 HISTORY — PX: LUMBAR LAMINECTOMY/DECOMPRESSION MICRODISCECTOMY: SHX5026

## 2011-05-18 HISTORY — PX: LUMBAR LAMINECTOMY: SHX95

## 2011-05-18 SURGERY — LUMBAR LAMINECTOMY/DECOMPRESSION MICRODISCECTOMY
Anesthesia: General | Laterality: Right | Wound class: Clean

## 2011-05-18 MED ORDER — SCOPOLAMINE 1 MG/3DAYS TD PT72
MEDICATED_PATCH | TRANSDERMAL | Status: DC | PRN
  Administered 2011-05-18: 1.5 mg via TRANSDERMAL

## 2011-05-18 MED ORDER — ONDANSETRON HCL 4 MG/2ML IJ SOLN: 4.0000 mg | INTRAMUSCULAR | Status: DC | PRN

## 2011-05-18 MED ORDER — SODIUM CHLORIDE 0.9 % IR SOLN
Status: DC | PRN
  Administered 2011-05-18: 1000 mL

## 2011-05-18 MED ORDER — HYDROMORPHONE HCL PF 1 MG/ML IJ SOLN
0.2500 mg | INTRAMUSCULAR | Status: DC | PRN
  Administered 2011-05-18 (×4): 0.5 mg via INTRAVENOUS

## 2011-05-18 MED ORDER — MAGNESIUM HYDROXIDE 400 MG/5ML PO SUSP: 30.0000 mL | Freq: Every day | ORAL | Status: DC | PRN

## 2011-05-18 MED ORDER — GEMFIBROZIL 600 MG PO TABS
600.0000 mg | ORAL_TABLET | Freq: Two times a day (BID) | ORAL | Status: DC
  Filled 2011-05-18 (×2): qty 1

## 2011-05-18 MED ORDER — MORPHINE SULFATE 4 MG/ML IJ SOLN: 1.0000 mg | INTRAMUSCULAR | Status: DC | PRN

## 2011-05-18 MED ORDER — MORPHINE SULFATE 15 MG PO TABS
30.0000 mg | ORAL_TABLET | Freq: Two times a day (BID) | ORAL | Status: DC
  Administered 2011-05-18: 30 mg via ORAL
  Filled 2011-05-18 (×2): qty 1

## 2011-05-18 MED ORDER — PROPOFOL 10 MG/ML IV EMUL
INTRAVENOUS | Status: DC | PRN
  Administered 2011-05-18: 100 mg via INTRAVENOUS
  Administered 2011-05-18: 160 mg via INTRAVENOUS

## 2011-05-18 MED ORDER — LACTATED RINGERS IV SOLN
INTRAVENOUS | Status: DC | PRN
  Administered 2011-05-18: 07:00:00 via INTRAVENOUS

## 2011-05-18 MED ORDER — SODIUM CHLORIDE 0.9 % IV SOLN
750.0000 mg | Freq: Once | INTRAVENOUS | Status: DC
  Filled 2011-05-18: qty 750

## 2011-05-18 MED ORDER — ACETAMINOPHEN 650 MG RE SUPP: 650.0000 mg | RECTAL | Status: DC | PRN

## 2011-05-18 MED ORDER — CYCLOBENZAPRINE HCL 10 MG PO TABS: 20.0000 mg | ORAL_TABLET | Freq: Every day | ORAL | Status: DC

## 2011-05-18 MED ORDER — LACTATED RINGERS IV SOLN: INTRAVENOUS | Status: DC

## 2011-05-18 MED ORDER — MIDAZOLAM HCL 5 MG/5ML IJ SOLN
INTRAMUSCULAR | Status: DC | PRN
  Administered 2011-05-18: 2 mg via INTRAVENOUS

## 2011-05-18 MED ORDER — SODIUM CHLORIDE 0.9 % IJ SOLN: 3.0000 mL | Freq: Two times a day (BID) | INTRAMUSCULAR | Status: DC

## 2011-05-18 MED ORDER — KCL IN DEXTROSE-NACL 20-5-0.45 MEQ/L-%-% IV SOLN
INTRAVENOUS | Status: DC
  Filled 2011-05-18 (×2): qty 1000

## 2011-05-18 MED ORDER — ALBUTEROL SULFATE HFA 108 (90 BASE) MCG/ACT IN AERS: 2.0000 | INHALATION_SPRAY | Freq: Two times a day (BID) | RESPIRATORY_TRACT | Status: DC | PRN

## 2011-05-18 MED ORDER — MIDAZOLAM HCL 2 MG/2ML IJ SOLN: 0.5000 mg | Freq: Once | INTRAMUSCULAR | Status: DC | PRN

## 2011-05-18 MED ORDER — PROMETHAZINE HCL 25 MG/ML IJ SOLN: 6.2500 mg | INTRAMUSCULAR | Status: DC | PRN

## 2011-05-18 MED ORDER — MORPHINE SULFATE 2 MG/ML IJ SOLN: 0.0500 mg/kg | INTRAMUSCULAR | Status: DC | PRN

## 2011-05-18 MED ORDER — KETOROLAC TROMETHAMINE 30 MG/ML IJ SOLN
30.0000 mg | Freq: Four times a day (QID) | INTRAMUSCULAR | Status: DC
Start: 2011-05-18 — End: 2011-05-18
  Administered 2011-05-18: 30 mg via INTRAVENOUS
  Filled 2011-05-18: qty 1

## 2011-05-18 MED ORDER — ROCURONIUM BROMIDE 100 MG/10ML IV SOLN
INTRAVENOUS | Status: DC | PRN
  Administered 2011-05-18: 40 mg via INTRAVENOUS

## 2011-05-18 MED ORDER — HYDROMORPHONE HCL PF 1 MG/ML IJ SOLN: 0.2500 mg | INTRAMUSCULAR | Status: DC | PRN

## 2011-05-18 MED ORDER — NEOSTIGMINE METHYLSULFATE 1 MG/ML IJ SOLN
INTRAMUSCULAR | Status: DC | PRN
  Administered 2011-05-18: 4 mg via INTRAVENOUS

## 2011-05-18 MED ORDER — NICOTINE 21 MG/24HR TD PT24
21.0000 mg | MEDICATED_PATCH | TRANSDERMAL | Status: DC
  Filled 2011-05-18: qty 1

## 2011-05-18 MED ORDER — LIDOCAINE-EPINEPHRINE 1 %-1:100000 IJ SOLN
INTRAMUSCULAR | Status: DC | PRN
  Administered 2011-05-18: 10 mL

## 2011-05-18 MED ORDER — BUDESONIDE-FORMOTEROL FUMARATE 160-4.5 MCG/ACT IN AERO
2.0000 | INHALATION_SPRAY | Freq: Two times a day (BID) | RESPIRATORY_TRACT | Status: DC
  Filled 2011-05-18: qty 6

## 2011-05-18 MED ORDER — HYDROXYUREA 500 MG PO CAPS
500.0000 mg | ORAL_CAPSULE | Freq: Every day | ORAL | Status: DC
  Filled 2011-05-18: qty 1

## 2011-05-18 MED ORDER — MEPERIDINE HCL 25 MG/ML IJ SOLN: 6.2500 mg | INTRAMUSCULAR | Status: DC | PRN

## 2011-05-18 MED ORDER — FENTANYL CITRATE 0.05 MG/ML IJ SOLN
INTRAMUSCULAR | Status: DC | PRN
  Administered 2011-05-18: 100 ug via INTRAVENOUS
  Administered 2011-05-18 (×2): 50 ug via INTRAVENOUS
  Administered 2011-05-18: 150 ug via INTRAVENOUS

## 2011-05-18 MED ORDER — PROMETHAZINE HCL 25 MG PO TABS: 25.0000 mg | ORAL_TABLET | Freq: Four times a day (QID) | ORAL | Status: DC | PRN

## 2011-05-18 MED ORDER — PROCHLORPERAZINE MALEATE 10 MG PO TABS
10.0000 mg | ORAL_TABLET | Freq: Four times a day (QID) | ORAL | Status: DC | PRN
  Filled 2011-05-18: qty 1

## 2011-05-18 MED ORDER — THROMBIN 5000 UNITS EX KIT
PACK | CUTANEOUS | Status: DC | PRN
  Administered 2011-05-18 (×2): 5000 [IU] via TOPICAL

## 2011-05-18 MED ORDER — SODIUM CHLORIDE 0.9 % IV SOLN: 250.0000 mL | INTRAVENOUS | Status: DC

## 2011-05-18 MED ORDER — IPRATROPIUM-ALBUTEROL 18-103 MCG/ACT IN AERO: 2.0000 | INHALATION_SPRAY | Freq: Four times a day (QID) | RESPIRATORY_TRACT | Status: DC | PRN

## 2011-05-18 MED ORDER — BISACODYL 10 MG RE SUPP: 10.0000 mg | Freq: Every day | RECTAL | Status: DC | PRN

## 2011-05-18 MED ORDER — DOCUSATE SODIUM 100 MG PO CAPS: 100.0000 mg | ORAL_CAPSULE | Freq: Two times a day (BID) | ORAL | Status: DC

## 2011-05-18 MED ORDER — HYDROCODONE-ACETAMINOPHEN 5-325 MG PO TABS: 1.0000 | ORAL_TABLET | ORAL | Status: DC | PRN

## 2011-05-18 MED ORDER — HEMOSTATIC AGENTS (NO CHARGE) OPTIME
TOPICAL | Status: DC | PRN
  Administered 2011-05-18: 1 via TOPICAL

## 2011-05-18 MED ORDER — LIDOCAINE 5 % EX PTCH
1.0000 | MEDICATED_PATCH | CUTANEOUS | Status: DC
  Administered 2011-05-18: 1 via TRANSDERMAL
  Filled 2011-05-18 (×2): qty 1

## 2011-05-18 MED ORDER — SODIUM CHLORIDE 0.9 % IR SOLN
Status: DC | PRN
  Administered 2011-05-18: 08:00:00

## 2011-05-18 MED ORDER — ONDANSETRON HCL 4 MG/2ML IJ SOLN
INTRAMUSCULAR | Status: DC | PRN
  Administered 2011-05-18 (×2): 4 mg via INTRAVENOUS

## 2011-05-18 MED ORDER — TIZANIDINE HCL 4 MG PO TABS
4.0000 mg | ORAL_TABLET | ORAL | Status: DC
  Filled 2011-05-18: qty 1

## 2011-05-18 MED ORDER — SODIUM CHLORIDE 0.9 % IJ SOLN
3.0000 mL | INTRAMUSCULAR | Status: DC | PRN
  Administered 2011-05-18: 3 mL via INTRAVENOUS

## 2011-05-18 MED ORDER — ACETAMINOPHEN 325 MG PO TABS: 650.0000 mg | ORAL_TABLET | ORAL | Status: DC | PRN

## 2011-05-18 MED ORDER — DRONABINOL 5 MG PO CAPS: 5.0000 mg | ORAL_CAPSULE | Freq: Two times a day (BID) | ORAL | Status: DC

## 2011-05-18 MED ORDER — GLYCOPYRROLATE 0.2 MG/ML IJ SOLN
INTRAMUSCULAR | Status: DC | PRN
  Administered 2011-05-18: .8 mg via INTRAVENOUS

## 2011-05-18 MED ORDER — HYDROMORPHONE HCL 2 MG PO TABS: 4.0000 mg | ORAL_TABLET | ORAL | Status: DC | PRN

## 2011-05-18 MED ORDER — SCOPOLAMINE 1 MG/3DAYS TD PT72: 1.0000 | MEDICATED_PATCH | Freq: Once | TRANSDERMAL | Status: DC

## 2011-05-18 MED ORDER — LEVETIRACETAM 500 MG PO TABS
1000.0000 mg | ORAL_TABLET | Freq: Two times a day (BID) | ORAL | Status: DC
  Administered 2011-05-18: 1000 mg via ORAL
  Filled 2011-05-18 (×2): qty 2

## 2011-05-18 MED ORDER — SODIUM CHLORIDE 0.9 % IV SOLN
10.0000 mg | INTRAVENOUS | Status: DC | PRN
  Administered 2011-05-18: 40 ug via INTRAVENOUS

## 2011-05-18 MED ORDER — OXYCODONE-ACETAMINOPHEN 5-325 MG PO TABS: 1.0000 | ORAL_TABLET | ORAL | Status: DC | PRN

## 2011-05-18 SURGICAL SUPPLY — 55 items
APL SKNCLS STERI-STRIP NONHPOA (GAUZE/BANDAGES/DRESSINGS) ×1
BAG DECANTER FOR FLEXI CONT (MISCELLANEOUS) ×2 IMPLANT
BENZOIN TINCTURE PRP APPL 2/3 (GAUZE/BANDAGES/DRESSINGS) ×1 IMPLANT
BLADE SURG ROTATE 9660 (MISCELLANEOUS) IMPLANT
BUR ACORN 6.0 ACORN (BURR) ×2 IMPLANT
BUR ACRON 5.0MM COATED (BURR) ×1 IMPLANT
CANISTER SUCTION 2500CC (MISCELLANEOUS) ×2 IMPLANT
CLOSURE STERI STRIP 1/2 X4 (GAUZE/BANDAGES/DRESSINGS) ×1 IMPLANT
CLOTH BEACON ORANGE TIMEOUT ST (SAFETY) ×2 IMPLANT
CONT SPEC 4OZ CLIKSEAL STRL BL (MISCELLANEOUS) IMPLANT
DRAPE LAPAROTOMY 100X72X124 (DRAPES) ×2 IMPLANT
DRAPE MICROSCOPE LEICA (MISCELLANEOUS) ×2 IMPLANT
DRAPE POUCH INSTRU U-SHP 10X18 (DRAPES) ×2 IMPLANT
DRESSING TELFA 8X3 (GAUZE/BANDAGES/DRESSINGS) IMPLANT
DURAPREP 26ML APPLICATOR (WOUND CARE) ×2 IMPLANT
ELECT REM PT RETURN 9FT ADLT (ELECTROSURGICAL) ×2
ELECTRODE REM PT RTRN 9FT ADLT (ELECTROSURGICAL) ×1 IMPLANT
GAUZE SPONGE 4X4 12PLY STRL LF (GAUZE/BANDAGES/DRESSINGS) ×1 IMPLANT
GAUZE SPONGE 4X4 16PLY XRAY LF (GAUZE/BANDAGES/DRESSINGS) IMPLANT
GLOVE BIOGEL PI IND STRL 8 (GLOVE) IMPLANT
GLOVE BIOGEL PI INDICATOR 8 (GLOVE) ×2
GLOVE ECLIPSE 7.5 STRL STRAW (GLOVE) ×2 IMPLANT
GLOVE EXAM NITRILE LRG STRL (GLOVE) IMPLANT
GLOVE EXAM NITRILE MD LF STRL (GLOVE) IMPLANT
GLOVE EXAM NITRILE XL STR (GLOVE) IMPLANT
GLOVE EXAM NITRILE XS STR PU (GLOVE) IMPLANT
GLOVE INDICATOR 8.0 STRL GRN (GLOVE) ×2 IMPLANT
GOWN BRE IMP SLV AUR LG STRL (GOWN DISPOSABLE) ×2 IMPLANT
GOWN BRE IMP SLV AUR XL STRL (GOWN DISPOSABLE) IMPLANT
GOWN STRL REIN 2XL LVL4 (GOWN DISPOSABLE) ×2 IMPLANT
KIT BASIN OR (CUSTOM PROCEDURE TRAY) ×2 IMPLANT
KIT ROOM TURNOVER OR (KITS) ×2 IMPLANT
NDL HYPO 18GX1.5 BLUNT FILL (NEEDLE) IMPLANT
NEEDLE HYPO 18GX1.5 BLUNT FILL (NEEDLE) IMPLANT
NEEDLE HYPO 22GX1.5 SAFETY (NEEDLE) ×4 IMPLANT
NS IRRIG 1000ML POUR BTL (IV SOLUTION) ×2 IMPLANT
PACK LAMINECTOMY NEURO (CUSTOM PROCEDURE TRAY) ×2 IMPLANT
PAD ARMBOARD 7.5X6 YLW CONV (MISCELLANEOUS) ×6 IMPLANT
PATTIES SURGICAL .75X.75 (GAUZE/BANDAGES/DRESSINGS) ×2 IMPLANT
RUBBERBAND STERILE (MISCELLANEOUS) ×4 IMPLANT
SPONGE GAUZE 4X4 12PLY (GAUZE/BANDAGES/DRESSINGS) IMPLANT
SPONGE LAP 4X18 X RAY DECT (DISPOSABLE) IMPLANT
SPONGE SURGIFOAM ABS GEL SZ50 (HEMOSTASIS) ×2 IMPLANT
STRIP CLOSURE SKIN 1/2X4 (GAUZE/BANDAGES/DRESSINGS) ×1 IMPLANT
SUT PROLENE 6 0 BV (SUTURE) IMPLANT
SUT VIC AB 0 CT1 18XCR BRD8 (SUTURE) ×1 IMPLANT
SUT VIC AB 0 CT1 8-18 (SUTURE) ×2
SUT VIC AB 2-0 CP2 18 (SUTURE) ×2 IMPLANT
SUT VIC AB 3-0 SH 8-18 (SUTURE) ×1 IMPLANT
SYR 20CC LL (SYRINGE) ×2 IMPLANT
SYR 5ML LL (SYRINGE) IMPLANT
TAPE CLOTH SURG 4X10 WHT LF (GAUZE/BANDAGES/DRESSINGS) ×2 IMPLANT
TOWEL OR 17X24 6PK STRL BLUE (TOWEL DISPOSABLE) ×2 IMPLANT
TOWEL OR 17X26 10 PK STRL BLUE (TOWEL DISPOSABLE) ×2 IMPLANT
WATER STERILE IRR 1000ML POUR (IV SOLUTION) ×2 IMPLANT

## 2011-05-18 NOTE — Progress Notes (Signed)
ANTIBIOTIC CONSULT NOTE - INITIAL  Pharmacy Consult for Vancomycin Indication: post-op lumbar laminectomy/decompression  Allergies  Allergen Reactions  . Penicillins Cross Reactors Anaphylaxis  . Actonel Other (See Comments)    "RADIATING PAIN"  . Adhesive (Tape) Hives    "REMOVES SKIN"  . Codeine Nausea And Vomiting  . Hydrogen Peroxide Other (See Comments)    "BURNS SKIN"  . Iohexol Hives     Code: HIVES, Desc: IVP DYE   . Nsaids Other (See Comments)    "causes reflux'  . Patanol (Olopatadine Hcl) Itching    Itchy watery eyes  . Statins Other (See Comments)    "radiating pain"  . Sulfa Antibiotics Other (See Comments)    " causes me to have asthma"  . Latex Rash    Patient Measurements: Height: 5\' 6"  (167.6 cm) Weight: 138 lb (62.596 kg) IBW/kg (Calculated) : 59.3    Vital Signs: Temp: 97.9 F (36.6 C) (12/06 1100) Temp src: Oral (12/06 1100) BP: 116/73 mmHg (12/06 1100) Pulse Rate: 64  (12/06 1100) Intake/Output from previous day:   Intake/Output from this shift: Total I/O In: 1900 [I.V.:1900] Out: 50 [Blood:50]  Labs:  [facility] 05/15/11 1230  WBC 8.9  HGB 9.9*  PLT 422*  LABCREA --  CREATININE 0.74   Estimated Creatinine Clearance: 70.9 ml/min (by C-G formula based on Cr of 0.74).   Microbiology: Recent Results (from the past 720 hour(s))  SURGICAL PCR SCREEN     Status: Normal   Collection Time   05/15/11 12:08 PM      Component Value Range Status Comment   MRSA, PCR NEGATIVE  NEGATIVE  Final    Staphylococcus aureus NEGATIVE  NEGATIVE  Final     Medical History: Past Medical History  Diagnosis Date  . Complication of anesthesia   . PONV (postoperative nausea and vomiting)   . Sarcoidosis     of bone marrow, sees Dr. Quenton Fetter on chemo med, last saw Oct 2012  . COPD (chronic obstructive pulmonary disease)   . Asthma   . Degenerative disk disease     back  . Chronic back pain   . Stye     right  . Pneumonia   . Bronchitis    hx of  . Blood transfusion     2002  . Chronic kidney disease     polynephritis hx of  . Anemia     August 2012  . Headache   . GERD (gastroesophageal reflux disease)     no medication to treat  . Fibromyalgia   . Arthritis   . Depression   . Anxiety   . Cancer     skin, sees dermatologist on occassion, Dr. Kym Groom    Medications:  Prescriptions prior to admission  Medication Sig Dispense Refill  . albuterol-ipratropium (COMBIVENT) 18-103 MCG/ACT inhaler Inhale 2 puffs into the lungs every 6 (six) hours as needed. FOR COPD SYMPTOMS        . budesonide-formoterol (SYMBICORT) 160-4.5 MCG/ACT inhaler Inhale 2 puffs into the lungs 2 (two) times daily.        . cyclobenzaprine (FLEXERIL) 10 MG tablet Take 20 mg by mouth at bedtime.        . dronabinol (MARINOL) 5 MG capsule Take 5 mg by mouth 2 (two) times daily before a meal.        . gemfibrozil (LOPID) 600 MG tablet Take 600 mg by mouth 2 (two) times daily before a meal.        .  HYDROmorphone (DILAUDID) 4 MG tablet Take 4 mg by mouth every 4 (four) hours as needed. FOR PAIN        . hydroxyurea (HYDREA) 500 MG capsule Take 500 mg by mouth daily. May take with food to minimize GI side effects.       . levETIRAcetam (KEPPRA) 500 MG tablet Take 1,000 mg by mouth every 12 (twelve) hours.        . lidocaine (LIDODERM) 5 % Place 1 patch onto the skin daily. Remove & Discard patch within 12 hours or as directed by MD       . morphine (MSIR) 30 MG tablet Take 30 mg by mouth every 8 (eight) hours as needed. FOR PAIN        . prochlorperazine (COMPAZINE) 10 MG tablet Take 10 mg by mouth every 6 (six) hours as needed. FOR NAUSEA        . promethazine (PHENERGAN) 25 MG tablet Take 25 mg by mouth every 6 (six) hours as needed.        Marland Kitchen tiZANidine (ZANAFLEX) 4 MG tablet Take 4 mg by mouth every morning.        Marland Kitchen albuterol (PROVENTIL HFA;VENTOLIN HFA) 108 (90 BASE) MCG/ACT inhaler Inhale 2 puffs into the lungs every 12 (twelve) hours as  needed. FOR COPD SYMPTOMS        . nicotine (NICODERM CQ - DOSED IN MG/24 HOURS) 21 mg/24hr patch Place 1 patch onto the skin daily.         Assessment: 60 y.o. Female s/p lumbar laminectomy/decrompression. Received 1 gm IV Vanco in OR at 0745. To received one more post-op dose.  Goal of Therapy:  Prevention of infection  Plan:  1. Vancomycin 750mg  IV x 1 at 1945 (12 hrs past pre-op dose) 2. Pharmacy will sign off, please reconsult if needed  Elizabeth Kirby 05/18/2011,12:24 PM

## 2011-05-18 NOTE — Preoperative (Signed)
Beta Blockers   Reason not to administer Beta Blockers:Not Applicable 

## 2011-05-18 NOTE — Interval H&P Note (Signed)
History and Physical Interval Note:  05/18/2011 7:43 AM  Elizabeth Kirby  has presented today for surgery, with the diagnosis of Lumbar stenosis, Lumbar spondylosis, Lumbar radiculopathy  The various methods of treatment have been discussed with the patient and family. After consideration of risks, benefits and other options for treatment, the patient has consented to  Procedure(s): LUMBAR LAMINECTOMY/DECOMPRESSION MICRODISCECTOMY as a surgical intervention .  The patients' history has been reviewed, patient examined, no change in status, stable for surgery.  I have reviewed the patients' chart and labs.  Questions were answered to the patient's satisfaction.     Roniesha Hollingshead R

## 2011-05-18 NOTE — Op Note (Signed)
05/18/2011  9:36 AM  PATIENT:  Elizabeth Kirby  60 y.o. female  PRE-OPERATIVE DIAGNOSIS:  Lumbar stenosis, Lumbar spondylosis, Lumbar radiculopathy  POST-OPERATIVE DIAGNOSIS:  Lumbar stenosis, Lumbar spondylosis, Lumbar radiculopathy,epidural mass  PROCEDURE:  Procedure(s): LUMBAR LAMINECTOMY/DECOMPRESSION , removal epidural mass, microdisection  SURGEON:  Surgeon(s): Rosaura Carpenter - assist    ANESTHESIA:   general  EBL:  Total I/O In: 1700 [I.V.:1700] Out: -   BLOOD ADMINISTERED:none  DRAINS: none   SPECIMEN:  Source of Specimen:  lumbar epidural space  DICTATION: Patient having severe back and right leg pain numbness. MRI done  showing severe spinal change at 45 epidural mass causing root compression.  patient brought in for surgery general anesthesia was induced patient placed in a prone position in a Wilson frame all pressure points padded and patient prepped and draped in a sterile fashion. Needle was placed in interspace x-rays obtained showing the needle was putting at L4-5 space. The site of incision injected with 10 cc 1% lidocaine with epinephrine. An incision made taken to the fascia. Fascia incised subperiosteal dissection done over the spinous process lamina to the facet. Markers placed in interspace x-rays obtained showing the marker at L4-5 space. Self-retaining retractors placed. Microscope was brought in for microdissection. Semi-hemilaminectomy was started with high-speed drill and completed with Kerrison punches. 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 It was adherent to the dura but we carefully dissected off the dura and resected the mass. Approximately sent for final pathology. We had good decompression of the canal and the 4 and V nerve roots. We did hemostasis we. About solution retractors removed fascia closed with 0 Vicryl interrupted sutures ,subcutaneous tissue closed with 20 and 30 Vicryl interrupted sutures skin closed benzoin  Steri-Strips dressing was placed patient was  transferred to recovery in stable condition .  PLAN OF CARE: Admit for overnight observation  PATIENT DISPOSITION:  PACU - hemodynamically stable.

## 2011-05-18 NOTE — Plan of Care (Signed)
Problem: Consults Goal: Diagnosis - Spinal Surgery Lumbar Laminectomy (Complex)     

## 2011-05-18 NOTE — Progress Notes (Signed)
  PATIENT DISCHARGED AS ORDERED VIA W/C. PT.VOIDING,EATING AND AMBULATING. NO NAUSEA, PO INTAKE IS ADEQUATE AND TOLERATED.NO REDNESS OR S/S OF INFECTION AROUND INCISION AREA. D/C INSTRUCTIONS AND HANDOUTS GIVEN TO PATIENT.PT.AND FAMILY MEMBER VERBALIZED UNDERSTANDING OF INSTRUCTION GIVEN.

## 2011-05-18 NOTE — Transfer of Care (Signed)
Immediate Anesthesia Transfer of Care Note  Patient: Elizabeth Kirby  Procedure(s) Performed:  LUMBAR LAMINECTOMY/DECOMPRESSION MICRODISCECTOMY - Right Lumbar four-five  Decompressive Laminectomy removal of epidural  mass  Patient Location: PACU  Anesthesia Type: General  Level of Consciousness: awake, alert  and oriented  Airway & Oxygen Therapy: Patient Spontanous Breathing and Patient connected to nasal cannula oxygen  Post-op Assessment: Report given to PACU RN, Post -op Vital signs reviewed and stable, Patient moving all extremities and Patient moving all extremities X 4  Post vital signs: Reviewed and stable  Complications: No apparent anesthesia complications

## 2011-05-18 NOTE — Anesthesia Preprocedure Evaluation (Addendum)
Anesthesia Evaluation  Patient identified by MRN, date of birth, ID band Patient awake    Reviewed: Allergy & Precautions, H&P , NPO status , Patient's Chart, lab work & pertinent test results, reviewed documented beta blocker date and time   History of Anesthesia Complications (+) PONV  Airway Mallampati: II      Dental  (+) Dental Advisory Given   Pulmonary asthma , pneumonia , COPD COPD inhaler,  clear to auscultation        Cardiovascular neg cardio ROS Regular Normal    Neuro/Psych  Headaches, PSYCHIATRIC DISORDERS Anxiety Depression Sacardosis- injections via port a cath Q 3 months  Neuromuscular disease    GI/Hepatic Neg liver ROS, GERD-  Controlled,  Endo/Other  Negative Endocrine ROS  Renal/GU negative Renal ROS  Genitourinary negative   Musculoskeletal  (+) Fibromyalgia -  Abdominal   Peds  Hematology negative hematology ROS (+)   Anesthesia Other Findings   Reproductive/Obstetrics negative OB ROS                          Anesthesia Physical Anesthesia Plan  ASA: III  Anesthesia Plan: General   Post-op Pain Management:    Induction:   Airway Management Planned: Oral ETT  Additional Equipment:   Intra-op Plan:   Post-operative Plan: Extubation in OR  Informed Consent: I have reviewed the patients History and Physical, chart, labs and discussed the procedure including the risks, benefits and alternatives for the proposed anesthesia with the patient or authorized representative who has indicated his/her understanding and acceptance.   Dental advisory given  Plan Discussed with:   Anesthesia Plan Comments:         Anesthesia Quick Evaluation

## 2011-05-18 NOTE — Anesthesia Postprocedure Evaluation (Signed)
  Anesthesia Post-op Note  Patient: Elizabeth Kirby  Procedure(s) Performed:  LUMBAR LAMINECTOMY/DECOMPRESSION MICRODISCECTOMY - Right Lumbar four-five  Decompressive Laminectomy removal of epidural  mass  Patient Location: PACU  Anesthesia Type: General  Level of Consciousness: awake  Airway and Oxygen Therapy: Patient Spontanous Breathing  Post-op Pain: mild  Post-op Assessment: Post-op Vital signs reviewed  Post-op Vital Signs: stable  Complications: No apparent anesthesia complications

## 2011-05-18 NOTE — H&P (Signed)
See h&P 

## 2011-05-18 NOTE — Plan of Care (Signed)
Problem: Consults Goal: Diagnosis - Spinal Surgery Outcome: Completed/Met Date Met:  05/18/11 Lumbar Laminectomy (Complex)     

## 2011-05-23 ENCOUNTER — Encounter (HOSPITAL_COMMUNITY): Payer: Self-pay | Admitting: Neurosurgery

## 2013-05-27 ENCOUNTER — Other Ambulatory Visit: Payer: Self-pay | Admitting: Neurosurgery

## 2013-06-19 NOTE — Pre-Procedure Instructions (Signed)
Elizabeth Kirby  06/19/2013   Your procedure is scheduled on:  Monday , Jan.12  Report to [facility] Main Entrance "A" at 6:45AM.  Call this number if you have problems the morning of surgery: [phone]   Remember:   Do not eat food or drink liquids after midnight.   Take these medicines the morning of surgery with A SIP OF WATER: proair, symbicort, marinol, dilaudid, keppra, robaxin,morphine,  Do not wear jewelry, make-up or nail polish.  Do not wear lotions, powders, or perfumes. You may wear deodorant.  Do not shave 48 hours prior to surgery. Men may shave face and neck.  Do not bring valuables to the hospital.  [facility] is not responsible  for any belongings or valuables.  Discontinue aspirin,coumadin,plavix, effient,and herbal medicines.             Contacts, dentures or bridgework may not be worn into surgery.  Leave suitcase in the car. After surgery it may be brought to your room.  For patients admitted to the hospital, discharge time is determined by your                treatment team.               Patients discharged the day of surgery will not be allowed to drive  home.  Name and phone number of your driver:   Special Instructions: Shower using CHG 2 nights before surgery and the night before surgery.  If you shower the day of surgery use CHG.  Use special wash - you have one bottle of CHG for all showers.  You should use approximately 1/3 of the bottle for each shower.   Please read over the following fact sheets that you were given: Pain Booklet, Coughing and Deep Breathing, MRSA Information and Surgical Site Infection Prevention

## 2013-06-20 ENCOUNTER — Encounter (HOSPITAL_COMMUNITY): Payer: Self-pay

## 2013-06-20 ENCOUNTER — Encounter (HOSPITAL_COMMUNITY)
Admission: RE | Admit: 2013-06-20 | Discharge: 2013-06-20 | Disposition: A | Discharge status: Home / Self Care | Payer: Medicaid Other | Source: Ambulatory Visit | Attending: Anesthesiology | Admitting: Anesthesiology

## 2013-06-20 ENCOUNTER — Encounter (HOSPITAL_COMMUNITY): Payer: Self-pay | Admitting: Pharmacy Technician

## 2013-06-20 ENCOUNTER — Encounter (HOSPITAL_COMMUNITY)
Admission: RE | Admit: 2013-06-20 | Discharge: 2013-06-20 | Disposition: A | Discharge status: Home / Self Care | Payer: Medicaid Other | Source: Ambulatory Visit | Attending: Neurosurgery | Admitting: Neurosurgery

## 2013-06-20 HISTORY — DX: Dizziness and giddiness: R42

## 2013-06-20 HISTORY — DX: Disorientation, unspecified: R41.0

## 2013-06-20 HISTORY — DX: Adverse effect of antineoplastic and immunosuppressive drugs, initial encounter: R11.2

## 2013-06-20 HISTORY — DX: Adverse effect of antineoplastic and immunosuppressive drugs, initial encounter: T45.1X5A

## 2013-06-20 HISTORY — DX: Shortness of breath: R06.02

## 2013-06-20 HISTORY — DX: Personal history of other venous thrombosis and embolism: Z86.718

## 2013-06-20 LAB — URINE MICROSCOPIC-ADD ON

## 2013-06-20 LAB — URINALYSIS, ROUTINE W REFLEX MICROSCOPIC
GLUCOSE, UA: NEGATIVE mg/dL
HGB URINE DIPSTICK: NEGATIVE
KETONES UR: NEGATIVE mg/dL
Leukocytes, UA: NEGATIVE
Nitrite: NEGATIVE
PROTEIN: 30 mg/dL — AB
Specific Gravity, Urine: 1.025 (ref 1.005–1.030)
Urobilinogen, UA: 0.2 mg/dL (ref 0.0–1.0)
pH: 5.5 (ref 5.0–8.0)

## 2013-06-20 LAB — CBC WITH DIFFERENTIAL/PLATELET
Basophils Absolute: 0 10*3/uL (ref 0.0–0.1)
Basophils Relative: 0 % (ref 0–1)
EOS ABS: 0.1 10*3/uL (ref 0.0–0.7)
EOS PCT: 1 % (ref 0–5)
HEMATOCRIT: 35 % — AB (ref 36.0–46.0)
Hemoglobin: 12.2 g/dL (ref 12.0–15.0)
LYMPHS ABS: 2.9 10*3/uL (ref 0.7–4.0)
Lymphocytes Relative: 24 % (ref 12–46)
MCH: 33.2 pg (ref 26.0–34.0)
MCHC: 34.9 g/dL (ref 30.0–36.0)
MCV: 95.4 fL (ref 78.0–100.0)
MONOS PCT: 7 % (ref 3–12)
Monocytes Absolute: 0.8 10*3/uL (ref 0.1–1.0)
Neutro Abs: 8.1 10*3/uL — ABNORMAL HIGH (ref 1.7–7.7)
Neutrophils Relative %: 68 % (ref 43–77)
Platelets: 381 10*3/uL (ref 150–400)
RBC: 3.67 MIL/uL — ABNORMAL LOW (ref 3.87–5.11)
RDW: 15 % (ref 11.5–15.5)
WBC: 12 10*3/uL — AB (ref 4.0–10.5)

## 2013-06-20 LAB — BASIC METABOLIC PANEL
BUN: 18 mg/dL (ref 6–23)
CALCIUM: 10.2 mg/dL (ref 8.4–10.5)
CHLORIDE: 95 meq/L — AB (ref 96–112)
CO2: 23 mEq/L (ref 19–32)
CREATININE: 0.92 mg/dL (ref 0.50–1.10)
GFR, EST AFRICAN AMERICAN: 76 mL/min — AB (ref >90)
GFR, EST NON AFRICAN AMERICAN: 66 mL/min — AB (ref >90)
Glucose, Bld: 98 mg/dL (ref 70–99)
Potassium: 3.7 mEq/L (ref 3.7–5.3)
Sodium: 134 mEq/L — ABNORMAL LOW (ref 137–147)

## 2013-06-20 LAB — SURGICAL PCR SCREEN
MRSA, PCR: NEGATIVE
Staphylococcus aureus: NEGATIVE

## 2013-06-20 NOTE — Progress Notes (Signed)
Anesthesia PAT Evaluation:  Patient is a 63 year old female scheduled for right L4-5 5 redo laminectomy, possible far lateral foraminotomy on 06/23/13 by Dr.Hirsch.   History includes recent former smoker, post-operative N/V, Sarcoidosis (involving bone marrow) diagnosed '03 with associated hypercalcemia for which she received pamidronate Q 3 months and essential thrombocytosis treated with hydroxyurea, COPD, asthma, DDD, remote history of PNA, pyelonephritis > 30 years ago, anemia, headaches, GERD, fibromyalgia, arthritis, depression, anxiety,skin cancer (BCC and ?; dermatologist Dr.Todd Jimmye Norman), partial thyroidectomy for benign nodule, LLE DVT '05, lumbar laminectomy '12. PCP is Dr. Marco Collie in [place]. HEM-ONC is Dr. Hosie Poisson at [facility], most recent note on chart.  She denies chest pain or SOB at rest.  Activity is limited due to pain.  She denies recent URI.  She does get mild LE edema.  She has not had a stress test in > 10 years. Exam shows a pleasant female in NAD.  Heart regular slightly tachycardic at 100 bpm.  No murmur noted. Lungs sounds initially with right intermittent expiratory wheeze that improved with deep breathing, clear on the left.  Up to 1+ BLE pretibial edema.  No carotid bruits noted.  Some limitation with neck movement, good mouth opening.  EKG on 06/20/13 showed NSR, question of unusual P axis.  EKG is not yet confirmed by cardiology, but I did review with anesthesiologist Dr. Al Corpus who did not have any additional recommendations.  CXR on 06/20/13 showed chronic bronchitic changes without evidence of acute cardiopulmonary disease. Chronic bronchitic changes.   Preoperative labs noted.  PT/PTT were not dated until 06/23/13, so a blue top tube was not used with labs and so these will need to be obtained on the day of surgery. If acceptable and otherwise no acute changes then I would anticipate that she could proceed as planned.  [facility] Short Stay  Center/Anesthesiology Phone [phone] 06/20/2013 3:50 PM

## 2013-06-20 NOTE — Progress Notes (Signed)
PCP: Dr. Marco Collie in [place], [place] [facility]  Ocologist:Dr. Hosie Poisson in [place] at [facility].

## 2013-06-22 MED ORDER — VANCOMYCIN HCL IN DEXTROSE 1-5 GM/200ML-% IV SOLN
1000.0000 mg | INTRAVENOUS | Status: AC
  Administered 2013-06-23: 1000 mg via INTRAVENOUS
  Filled 2013-06-22: qty 200

## 2013-06-23 ENCOUNTER — Ambulatory Visit (HOSPITAL_COMMUNITY)
Admission: RE | Admit: 2013-06-23 | Discharge: 2013-06-24 | Disposition: A | Discharge status: Home / Self Care | Payer: Medicaid Other | Source: Ambulatory Visit | Attending: Neurosurgery | Admitting: Neurosurgery

## 2013-06-23 ENCOUNTER — Encounter (HOSPITAL_COMMUNITY): Payer: Self-pay | Admitting: *Deleted

## 2013-06-23 ENCOUNTER — Encounter (HOSPITAL_COMMUNITY): Payer: Medicaid Other | Admitting: Vascular Surgery

## 2013-06-23 ENCOUNTER — Ambulatory Visit (HOSPITAL_COMMUNITY): Payer: Medicaid Other | Admitting: Certified Registered"

## 2013-06-23 ENCOUNTER — Observation Stay (HOSPITAL_COMMUNITY): Payer: Medicaid Other

## 2013-06-23 ENCOUNTER — Encounter (HOSPITAL_COMMUNITY)
Admission: RE | Disposition: A | Discharge status: Home / Self Care | Payer: Self-pay | Source: Ambulatory Visit | Attending: Neurosurgery

## 2013-06-23 DIAGNOSIS — J449 Chronic obstructive pulmonary disease, unspecified: Secondary | ICD-10-CM | POA: Insufficient documentation

## 2013-06-23 DIAGNOSIS — J4489 Other specified chronic obstructive pulmonary disease: Secondary | ICD-10-CM | POA: Insufficient documentation

## 2013-06-23 DIAGNOSIS — M47817 Spondylosis without myelopathy or radiculopathy, lumbosacral region: Secondary | ICD-10-CM | POA: Insufficient documentation

## 2013-06-23 DIAGNOSIS — F411 Generalized anxiety disorder: Secondary | ICD-10-CM | POA: Insufficient documentation

## 2013-06-23 DIAGNOSIS — Z87891 Personal history of nicotine dependence: Secondary | ICD-10-CM | POA: Insufficient documentation

## 2013-06-23 DIAGNOSIS — F329 Major depressive disorder, single episode, unspecified: Secondary | ICD-10-CM | POA: Insufficient documentation

## 2013-06-23 DIAGNOSIS — F3289 Other specified depressive episodes: Secondary | ICD-10-CM | POA: Insufficient documentation

## 2013-06-23 DIAGNOSIS — Z01810 Encounter for preprocedural cardiovascular examination: Secondary | ICD-10-CM | POA: Insufficient documentation

## 2013-06-23 DIAGNOSIS — IMO0001 Reserved for inherently not codable concepts without codable children: Secondary | ICD-10-CM | POA: Insufficient documentation

## 2013-06-23 DIAGNOSIS — M47816 Spondylosis without myelopathy or radiculopathy, lumbar region: Secondary | ICD-10-CM | POA: Diagnosis present

## 2013-06-23 DIAGNOSIS — Z01818 Encounter for other preprocedural examination: Secondary | ICD-10-CM | POA: Insufficient documentation

## 2013-06-23 DIAGNOSIS — Z01812 Encounter for preprocedural laboratory examination: Secondary | ICD-10-CM | POA: Insufficient documentation

## 2013-06-23 HISTORY — PX: LUMBAR LAMINECTOMY/DECOMPRESSION MICRODISCECTOMY: SHX5026

## 2013-06-23 LAB — PROTIME-INR
INR: 1.02 (ref 0.00–1.49)
Prothrombin Time: 13.2 seconds (ref 11.6–15.2)

## 2013-06-23 LAB — APTT: aPTT: 31 seconds (ref 24–37)

## 2013-06-23 SURGERY — LUMBAR LAMINECTOMY/DECOMPRESSION MICRODISCECTOMY 1 LEVEL
Anesthesia: General | Site: Spine Lumbar | Laterality: Right

## 2013-06-23 MED ORDER — LIDOCAINE HCL (CARDIAC) 20 MG/ML IV SOLN
INTRAVENOUS | Status: DC | PRN
Start: 2013-06-23 — End: 2013-06-23
  Administered 2013-06-23: 80 mg via INTRAVENOUS

## 2013-06-23 MED ORDER — OXYCODONE HCL 5 MG PO TABS
ORAL_TABLET | ORAL | Status: AC
  Filled 2013-06-23: qty 1

## 2013-06-23 MED ORDER — BISACODYL 10 MG RE SUPP: 10.0000 mg | Freq: Every day | RECTAL | Status: DC | PRN

## 2013-06-23 MED ORDER — ACETAMINOPHEN 160 MG/5ML PO SOLN
325.0000 mg | ORAL | Status: DC | PRN
  Filled 2013-06-23: qty 20.3

## 2013-06-23 MED ORDER — VANCOMYCIN HCL IN DEXTROSE 1-5 GM/200ML-% IV SOLN
1000.0000 mg | Freq: Once | INTRAVENOUS | Status: AC
  Administered 2013-06-23: 1000 mg via INTRAVENOUS
  Filled 2013-06-23: qty 200

## 2013-06-23 MED ORDER — MAGNESIUM HYDROXIDE 400 MG/5ML PO SUSP: 30.0000 mL | Freq: Every day | ORAL | Status: DC | PRN

## 2013-06-23 MED ORDER — THROMBIN 5000 UNITS EX SOLR
CUTANEOUS | Status: DC | PRN
  Administered 2013-06-23 (×2): 5000 [IU] via TOPICAL

## 2013-06-23 MED ORDER — SODIUM CHLORIDE 0.9 % IV SOLN: 250.0000 mL | INTRAVENOUS | Status: DC

## 2013-06-23 MED ORDER — FENTANYL CITRATE 0.05 MG/ML IJ SOLN
INTRAMUSCULAR | Status: DC | PRN
  Administered 2013-06-23: 100 ug via INTRAVENOUS
  Administered 2013-06-23: 50 ug via INTRAVENOUS

## 2013-06-23 MED ORDER — LACTATED RINGERS IV SOLN: INTRAVENOUS | Status: DC

## 2013-06-23 MED ORDER — DRONABINOL 2.5 MG PO CAPS
5.0000 mg | ORAL_CAPSULE | Freq: Four times a day (QID) | ORAL | Status: DC
  Administered 2013-06-23 – 2013-06-24 (×2): 5 mg via ORAL
  Filled 2013-06-23 (×2): qty 1
  Filled 2013-06-23 (×2): qty 2

## 2013-06-23 MED ORDER — ONDANSETRON HCL 4 MG/2ML IJ SOLN: 4.0000 mg | Freq: Once | INTRAMUSCULAR | Status: DC | PRN

## 2013-06-23 MED ORDER — LEVETIRACETAM 500 MG PO TABS
1000.0000 mg | ORAL_TABLET | Freq: Two times a day (BID) | ORAL | Status: DC
  Administered 2013-06-24: 1000 mg via ORAL
  Filled 2013-06-23 (×3): qty 2

## 2013-06-23 MED ORDER — KETAMINE HCL 10 MG/ML IJ SOLN
50.0000 mg | Freq: Once | INTRAMUSCULAR | Status: DC
  Filled 2013-06-23: qty 5

## 2013-06-23 MED ORDER — HYDROMORPHONE HCL 2 MG PO TABS
8.0000 mg | ORAL_TABLET | Freq: Four times a day (QID) | ORAL | Status: DC | PRN
  Administered 2013-06-23 – 2013-06-24 (×2): 8 mg via ORAL
  Filled 2013-06-23 (×2): qty 4

## 2013-06-23 MED ORDER — LORATADINE 10 MG PO TABS
10.0000 mg | ORAL_TABLET | Freq: Every day | ORAL | Status: DC
  Administered 2013-06-24: 10 mg via ORAL
  Filled 2013-06-23: qty 1

## 2013-06-23 MED ORDER — PROMETHAZINE HCL 25 MG/ML IJ SOLN: 12.5000 mg | INTRAMUSCULAR | Status: DC | PRN

## 2013-06-23 MED ORDER — SODIUM CHLORIDE 0.9 % IR SOLN
Status: DC | PRN
  Administered 2013-06-23: 11:00:00

## 2013-06-23 MED ORDER — METHYLPREDNISOLONE ACETATE 80 MG/ML IJ SUSP
INTRAMUSCULAR | Status: DC | PRN
  Administered 2013-06-23: 80 mg

## 2013-06-23 MED ORDER — LIDOCAINE-EPINEPHRINE 1 %-1:100000 IJ SOLN
INTRAMUSCULAR | Status: DC | PRN
  Administered 2013-06-23: 20 mL

## 2013-06-23 MED ORDER — HYDROMORPHONE HCL PF 1 MG/ML IJ SOLN
INTRAMUSCULAR | Status: AC
  Filled 2013-06-23: qty 1

## 2013-06-23 MED ORDER — BUDESONIDE-FORMOTEROL FUMARATE 160-4.5 MCG/ACT IN AERO
1.0000 | INHALATION_SPRAY | Freq: Two times a day (BID) | RESPIRATORY_TRACT | Status: DC | PRN
  Administered 2013-06-24: 1 via RESPIRATORY_TRACT
  Filled 2013-06-23: qty 6

## 2013-06-23 MED ORDER — LACTATED RINGERS IV SOLN
INTRAVENOUS | Status: DC
  Administered 2013-06-23 (×2): via INTRAVENOUS

## 2013-06-23 MED ORDER — ALBUTEROL SULFATE HFA 108 (90 BASE) MCG/ACT IN AERS
INHALATION_SPRAY | RESPIRATORY_TRACT | Status: DC | PRN
  Administered 2013-06-23: 4 via RESPIRATORY_TRACT

## 2013-06-23 MED ORDER — ACETAMINOPHEN 325 MG PO TABS: 650.0000 mg | ORAL_TABLET | ORAL | Status: DC | PRN

## 2013-06-23 MED ORDER — VITAMIN D3 25 MCG (1000 UNIT) PO TABS
2000.0000 [IU] | ORAL_TABLET | Freq: Every day | ORAL | Status: DC
  Administered 2013-06-24: 2000 [IU] via ORAL
  Filled 2013-06-23: qty 2

## 2013-06-23 MED ORDER — CYCLOBENZAPRINE HCL 10 MG PO TABS
20.0000 mg | ORAL_TABLET | Freq: Every day | ORAL | Status: DC
  Administered 2013-06-23: 20 mg via ORAL
  Filled 2013-06-23 (×2): qty 2

## 2013-06-23 MED ORDER — SODIUM CHLORIDE 0.9 % IJ SOLN
3.0000 mL | Freq: Two times a day (BID) | INTRAMUSCULAR | Status: DC
  Administered 2013-06-23: 3 mL via INTRAVENOUS

## 2013-06-23 MED ORDER — DEXAMETHASONE SODIUM PHOSPHATE 4 MG/ML IJ SOLN
INTRAMUSCULAR | Status: DC | PRN
  Administered 2013-06-23: 8 mg via INTRAVENOUS

## 2013-06-23 MED ORDER — METHOCARBAMOL 500 MG PO TABS
ORAL_TABLET | ORAL | Status: DC
Start: 2013-06-23 — End: 2013-06-23
  Filled 2013-06-23: qty 2

## 2013-06-23 MED ORDER — ACETAMINOPHEN 325 MG PO TABS: 325.0000 mg | ORAL_TABLET | ORAL | Status: DC | PRN

## 2013-06-23 MED ORDER — KETAMINE HCL 10 MG/ML IJ SOLN
INTRAMUSCULAR | Status: DC | PRN
  Administered 2013-06-23 (×2): 10 mg via INTRAVENOUS
  Administered 2013-06-23: 20 mg via INTRAVENOUS
  Administered 2013-06-23: 10 mg via INTRAVENOUS

## 2013-06-23 MED ORDER — ACETAMINOPHEN 650 MG RE SUPP: 650.0000 mg | RECTAL | Status: DC | PRN

## 2013-06-23 MED ORDER — MORPHINE SULFATE 15 MG PO TABS
30.0000 mg | ORAL_TABLET | Freq: Three times a day (TID) | ORAL | Status: DC
  Administered 2013-06-23 – 2013-06-24 (×2): 30 mg via ORAL
  Filled 2013-06-23 (×2): qty 2

## 2013-06-23 MED ORDER — PROCHLORPERAZINE MALEATE 10 MG PO TABS
10.0000 mg | ORAL_TABLET | Freq: Four times a day (QID) | ORAL | Status: DC | PRN
  Filled 2013-06-23: qty 1

## 2013-06-23 MED ORDER — OXYCODONE HCL 5 MG/5ML PO SOLN: 5.0000 mg | Freq: Once | ORAL | Status: AC | PRN

## 2013-06-23 MED ORDER — ONDANSETRON HCL 4 MG/2ML IJ SOLN: 4.0000 mg | INTRAMUSCULAR | Status: DC | PRN

## 2013-06-23 MED ORDER — PROMETHAZINE HCL 25 MG PO TABS: 12.5000 mg | ORAL_TABLET | ORAL | Status: DC | PRN

## 2013-06-23 MED ORDER — METHOCARBAMOL 500 MG PO TABS
1000.0000 mg | ORAL_TABLET | Freq: Two times a day (BID) | ORAL | Status: DC
  Administered 2013-06-23 – 2013-06-24 (×3): 1000 mg via ORAL
  Filled 2013-06-23 (×3): qty 2

## 2013-06-23 MED ORDER — OXYCODONE HCL 5 MG PO TABS
5.0000 mg | ORAL_TABLET | Freq: Once | ORAL | Status: AC | PRN
  Administered 2013-06-23: 5 mg via ORAL

## 2013-06-23 MED ORDER — DOCUSATE SODIUM 100 MG PO CAPS
100.0000 mg | ORAL_CAPSULE | Freq: Two times a day (BID) | ORAL | Status: DC
  Administered 2013-06-23 – 2013-06-24 (×2): 100 mg via ORAL
  Filled 2013-06-23 (×3): qty 1

## 2013-06-23 MED ORDER — 0.9 % SODIUM CHLORIDE (POUR BTL) OPTIME
TOPICAL | Status: DC | PRN
  Administered 2013-06-23: 1000 mL

## 2013-06-23 MED ORDER — HEMOSTATIC AGENTS (NO CHARGE) OPTIME
TOPICAL | Status: DC | PRN
  Administered 2013-06-23: 1 via TOPICAL

## 2013-06-23 MED ORDER — ROCURONIUM BROMIDE 100 MG/10ML IV SOLN
INTRAVENOUS | Status: DC | PRN
  Administered 2013-06-23: 40 mg via INTRAVENOUS

## 2013-06-23 MED ORDER — LIDOCAINE VISCOUS 2 % MT SOLN: 20.0000 mL | OROMUCOSAL | Status: DC | PRN

## 2013-06-23 MED ORDER — HYDROXYUREA 500 MG PO CAPS
500.0000 mg | ORAL_CAPSULE | Freq: Every evening | ORAL | Status: DC
Start: 2013-06-23 — End: 2013-06-24
  Administered 2013-06-23: 500 mg via ORAL
  Filled 2013-06-23 (×2): qty 1

## 2013-06-23 MED ORDER — POLYETHYLENE GLYCOL 3350 17 G PO PACK
17.0000 g | PACK | Freq: Every day | ORAL | Status: DC
  Administered 2013-06-24: 17 g via ORAL
  Filled 2013-06-23 (×2): qty 1

## 2013-06-23 MED ORDER — PROPOFOL 10 MG/ML IV BOLUS
INTRAVENOUS | Status: DC | PRN
  Administered 2013-06-23: 140 mg via INTRAVENOUS

## 2013-06-23 MED ORDER — MIDAZOLAM HCL 5 MG/5ML IJ SOLN
INTRAMUSCULAR | Status: DC | PRN
  Administered 2013-06-23: 2 mg via INTRAVENOUS

## 2013-06-23 MED ORDER — MORPHINE SULFATE 2 MG/ML IJ SOLN: 1.0000 mg | INTRAMUSCULAR | Status: DC | PRN

## 2013-06-23 MED ORDER — SODIUM CHLORIDE 0.9 % IJ SOLN: 3.0000 mL | INTRAMUSCULAR | Status: DC | PRN

## 2013-06-23 MED ORDER — HYDROMORPHONE HCL PF 1 MG/ML IJ SOLN
0.2500 mg | INTRAMUSCULAR | Status: DC | PRN
  Administered 2013-06-23 (×4): 0.5 mg via INTRAVENOUS

## 2013-06-23 SURGICAL SUPPLY — 56 items
APL SKNCLS STERI-STRIP NONHPOA (GAUZE/BANDAGES/DRESSINGS) ×1
BAG DECANTER FOR FLEXI CONT (MISCELLANEOUS) ×3 IMPLANT
BENZOIN TINCTURE PRP APPL 2/3 (GAUZE/BANDAGES/DRESSINGS) ×3 IMPLANT
BLADE SURG ROTATE 9660 (MISCELLANEOUS) IMPLANT
BUR ROUND FLUTED 5 RND (BURR) ×2 IMPLANT
BUR ROUND FLUTED 5MM RND (BURR) ×1
CANISTER SUCT 3000ML (MISCELLANEOUS) ×3 IMPLANT
CLOSURE WOUND 1/2 X4 (GAUZE/BANDAGES/DRESSINGS) ×1
CONT SPEC 4OZ CLIKSEAL STRL BL (MISCELLANEOUS) ×2 IMPLANT
DRAPE LAPAROTOMY 100X72X124 (DRAPES) ×3 IMPLANT
DRAPE MICROSCOPE LEICA (MISCELLANEOUS) ×3 IMPLANT
DRAPE POUCH INSTRU U-SHP 10X18 (DRAPES) ×3 IMPLANT
DRAPE SURG 17X23 STRL (DRAPES) ×3 IMPLANT
DRESSING TELFA 8X3 (GAUZE/BANDAGES/DRESSINGS) ×3 IMPLANT
DURAPREP 26ML APPLICATOR (WOUND CARE) ×3 IMPLANT
ELECT REM PT RETURN 9FT ADLT (ELECTROSURGICAL) ×3
ELECTRODE REM PT RTRN 9FT ADLT (ELECTROSURGICAL) ×1 IMPLANT
GAUZE SPONGE 4X4 16PLY XRAY LF (GAUZE/BANDAGES/DRESSINGS) IMPLANT
GLOVE BIOGEL PI IND STRL 7.0 (GLOVE) IMPLANT
GLOVE BIOGEL PI IND STRL 8.5 (GLOVE) IMPLANT
GLOVE BIOGEL PI INDICATOR 7.0 (GLOVE) ×4
GLOVE BIOGEL PI INDICATOR 8.5 (GLOVE) ×2
GLOVE ECLIPSE 7.5 STRL STRAW (GLOVE) ×3 IMPLANT
GLOVE EXAM NITRILE LRG STRL (GLOVE) IMPLANT
GLOVE EXAM NITRILE MD LF STRL (GLOVE) IMPLANT
GLOVE EXAM NITRILE XL STR (GLOVE) IMPLANT
GLOVE EXAM NITRILE XS STR PU (GLOVE) IMPLANT
GLOVE SURG SS PI 7.0 STRL IVOR (GLOVE) ×4 IMPLANT
GOWN BRE IMP SLV AUR LG STRL (GOWN DISPOSABLE) ×3 IMPLANT
GOWN BRE IMP SLV AUR XL STRL (GOWN DISPOSABLE) ×2 IMPLANT
GOWN STRL REIN 2XL LVL4 (GOWN DISPOSABLE) ×2 IMPLANT
KIT BASIN OR (CUSTOM PROCEDURE TRAY) ×3 IMPLANT
KIT ROOM TURNOVER OR (KITS) ×3 IMPLANT
NDL HYPO 18GX1.5 BLUNT FILL (NEEDLE) IMPLANT
NEEDLE HYPO 18GX1.5 BLUNT FILL (NEEDLE) ×3 IMPLANT
NEEDLE HYPO 22GX1.5 SAFETY (NEEDLE) ×6 IMPLANT
NS IRRIG 1000ML POUR BTL (IV SOLUTION) ×3 IMPLANT
PACK LAMINECTOMY NEURO (CUSTOM PROCEDURE TRAY) ×3 IMPLANT
PAD ARMBOARD 7.5X6 YLW CONV (MISCELLANEOUS) ×13 IMPLANT
PATTIES SURGICAL .75X.75 (GAUZE/BANDAGES/DRESSINGS) ×3 IMPLANT
RUBBERBAND STERILE (MISCELLANEOUS) ×6 IMPLANT
SPONGE GAUZE 4X4 12PLY (GAUZE/BANDAGES/DRESSINGS) ×3 IMPLANT
SPONGE LAP 4X18 X RAY DECT (DISPOSABLE) IMPLANT
SPONGE SURGIFOAM ABS GEL SZ50 (HEMOSTASIS) ×3 IMPLANT
STRIP CLOSURE SKIN 1/2X4 (GAUZE/BANDAGES/DRESSINGS) ×2 IMPLANT
SUT PROLENE 6 0 BV (SUTURE) IMPLANT
SUT VIC AB 0 CT1 18XCR BRD8 (SUTURE) ×1 IMPLANT
SUT VIC AB 0 CT1 8-18 (SUTURE) ×12
SUT VIC AB 2-0 CP2 18 (SUTURE) ×3 IMPLANT
SUT VIC AB 3-0 SH 8-18 (SUTURE) ×3 IMPLANT
SYR 20ML ECCENTRIC (SYRINGE) ×3 IMPLANT
SYR 5ML LL (SYRINGE) ×3 IMPLANT
TAPE CLOTH SURG 4X10 WHT LF (GAUZE/BANDAGES/DRESSINGS) ×2 IMPLANT
TOWEL OR 17X24 6PK STRL BLUE (TOWEL DISPOSABLE) ×3 IMPLANT
TOWEL OR 17X26 10 PK STRL BLUE (TOWEL DISPOSABLE) ×3 IMPLANT
WATER STERILE IRR 1000ML POUR (IV SOLUTION) ×3 IMPLANT

## 2013-06-23 NOTE — H&P (Signed)
See H& P.

## 2013-06-23 NOTE — Preoperative (Signed)
Beta Blockers   Reason not to administer Beta Blockers:Not Applicable 

## 2013-06-23 NOTE — Interval H&P Note (Signed)
History and Physical Interval Note:  06/23/2013 7:33 AM  Elizabeth Kirby  has presented today for surgery, with the diagnosis of Lumbar spondylosis, radiculopathy  The various methods of treatment have been discussed with the patient and family. After consideration of risks, benefits and other options for treatment, the patient has consented to  Procedure(s) with comments: LUMBAR LAMINECTOMY/DECOMPRESSION MICRODISCECTOMY 1 LEVEL (Right) - Right L4-5 Redo Laminectomy possible far lateral foraminotomy as a surgical intervention .  The patient's history has been reviewed, patient examined, no change in status, stable for surgery.  I have reviewed the patient's chart and labs.  Questions were answered to the patient's satisfaction.     Poonam Woehrle R

## 2013-06-23 NOTE — Anesthesia Postprocedure Evaluation (Signed)
  Anesthesia Post-op Note  Patient: Elizabeth Kirby  Procedure(s) Performed: Procedure(s) with comments: RIGHT LUMBAR FOUR-FIVE REDO LAMINECTOMY;POSSIBLE FAR LATERAL FORAMINOTOMY (Right) - right  Patient Location: PACU  Anesthesia Type:General  Level of Consciousness: awake, alert  and oriented  Airway and Oxygen Therapy: Patient Spontanous Breathing and Patient connected to nasal cannula oxygen  Post-op Pain: mild  Post-op Assessment: Post-op Vital signs reviewed, Patient's Cardiovascular Status Stable, Respiratory Function Stable, Patent Airway and No signs of Nausea or vomiting  Post-op Vital Signs: Reviewed and stable  Complications: No apparent anesthesia complications

## 2013-06-23 NOTE — Transfer of Care (Signed)
Immediate Anesthesia Transfer of Care Note  Patient: Elizabeth Kirby  Procedure(s) Performed: Procedure(s) with comments: RIGHT LUMBAR FOUR-FIVE REDO LAMINECTOMY;POSSIBLE FAR LATERAL FORAMINOTOMY (Right) - right  Patient Location: PACU  Anesthesia Type:General  Level of Consciousness: sedated and responds to stimulation  Airway & Oxygen Therapy: Patient Spontanous Breathing and Patient connected to nasal cannula oxygen  Post-op Assessment: Report given to PACU RN, Post -op Vital signs reviewed and stable and Patient moving all extremities  Post vital signs: Reviewed and stable  Complications: No apparent anesthesia complications

## 2013-06-23 NOTE — Progress Notes (Signed)
Vanc post op  63 yo who is s/p laminectomy. She did received vanc preop. Plan to dc tomorrow so will get vanc x1 post op after 12hrs  Plan  Vanc 1g IV x1

## 2013-06-23 NOTE — Anesthesia Preprocedure Evaluation (Signed)
Anesthesia Evaluation  Patient identified by MRN, date of birth, ID band Patient awake    Reviewed: Allergy & Precautions, H&P , NPO status , Patient's Chart, lab work & pertinent test results  History of Anesthesia Complications (+) PONV  Airway Mallampati: II TM Distance: >3 FB Neck ROM: Full    Dental  (+) Edentulous Upper   Pulmonary shortness of breath and with exertion, asthma , COPD COPD inhaler, former smoker,    Pulmonary exam normal       Cardiovascular negative cardio ROS  Rhythm:Regular Rate:Normal     Neuro/Psych Anxiety Depression Chronic low back pain s/p back surgery. Intermittent numbness and weakness on the right leg. Chronic pain management with epidural injections and flexeril,robaxin,keppra, morphine, dilaudid  Neuromuscular disease    GI/Hepatic negative GI ROS,   Endo/Other  negative endocrine ROS  Renal/GU negative Renal ROS  negative genitourinary   Musculoskeletal  (+) Fibromyalgia -  Abdominal   Peds  Hematology  (+) anemia ,   Anesthesia Other Findings   Reproductive/Obstetrics                           Anesthesia Physical Anesthesia Plan  ASA: III  Anesthesia Plan: General   Post-op Pain Management:    Induction: Intravenous  Airway Management Planned: Oral ETT  Additional Equipment: None  Intra-op Plan:   Post-operative Plan: Extubation in OR  Informed Consent: I have reviewed the patients History and Physical, chart, labs and discussed the procedure including the risks, benefits and alternatives for the proposed anesthesia with the patient or authorized representative who has indicated his/her understanding and acceptance.   Dental advisory given  Plan Discussed with: CRNA and Surgeon  Anesthesia Plan Comments:         Anesthesia Quick Evaluation

## 2013-06-23 NOTE — Op Note (Signed)
06/23/2013  12:31 PM  PATIENT:  Elizabeth Kirby  63 y.o. female  PRE-OPERATIVE DIAGNOSIS:  Lumbar spondylosis, radiculopathy, right L4-5  POST-OPERATIVE DIAGNOSIS:  same  PROCEDURE:  Procedure(s): RIGHT LUMBAR FOUR-FIVE REDO LAMINECTOMY, foremenotomy, microdisection  SURGEON:  Surgeon(s): Otilio Connors, MD Kristeen Miss, MD-ASSISTANT  ANESTHESIA:   general  EBL:  Total I/O In: 1400 [I.V.:1400] Out: 34 [Blood:50]  BLOOD ADMINISTERED:none  DRAINS: none   SPECIMEN:  No Specimen  DICTATION: Patient with back right leg pain and numbness prior surgery right-sided 454 and discectomy is recurrence of pain rating her leg in the right L4 type distribution epidural injections then done and they've given brief relief the helped all MRI was done showing a type changes return for followup with severe foraminal stenosis L4 root compression due to spinal change facet hypertrophy synovial cyst scar tissue circuitry patient brought in for redo decompressive lamina right-sided 45 and a foraminotomy.  Patient brought into operating room general anesthesia induced patient placed in prone position Wilson frame all pressure points padded. Patient prepped draped sterile fashion 7 incision injected with 20 cc 1% lidocaine with epinephrine. The previous incision midline lower lumbar spine was incised and subperiosteal dissection done over the L5-L4 spinous process lamina to the facet. Markers placed interspace x-rays obtained showing the marker at C3-4 levels we exposed [place] bone clearly remove the scar tissue at this point and carefully dissecting the scar or marker at the space took another x-ray and confirmed or positioning at L4-5. Microscope was brought in for microdissection this point and using a high-speed were drilled straightly reducing any laminectomy medial facetectomy is completed with Kerrison punches we carefully dissected through the scar and then to do space the pedicles before and the went up  above that and comes hypertrophic ligament with calcifications within the center of at all causing a significant is a recess stenosis and foraminal stenosis we carefully dissected through this room plan the dura and following that the plane removed the spinal change causing L4 nerve root compression. Using a brain curettes Kerrison punches continued to decompressing the foramen and foraminotomies done to decompress the 4 root were finished we did decompression the 4 root. We. About solution and hemostasis muscle was done no sign of CSF leak Depo-Medrol was placed with nerve retractors removed fascia with 0 Vicryl interrupted sutures excess tissue closed with 021 through Vicryl interrupted sutures skin closed benzoin Steri-Strips dressing was placed patient placed back in spine position woken from anesthesia and transferred to recovery.  PLAN OF CARE: Admit for overnight observation  PATIENT DISPOSITION:  PACU - hemodynamically stable.

## 2013-06-23 NOTE — Discharge Summary (Signed)
Physician Discharge Summary  Patient ID: Elizabeth Kirby MRN: [id number] DOB/AGE: 1951/03/09 63 y.o.  Admit date: 06/23/2013 Discharge date: 06/23/2013  Admission Diagnoses:Lumbar spondylosis, radiculopathy, right L4-5   Discharge Diagnoses: Lumbar spondylosis, radiculopathy, right L4-5  Active Problems:   Lumbar spondylosis   Discharged Condition: good  Hospital Course: pt admitted on day of surgery  - underwent procedure below  - pt with less leg pain , ambulating, voiding, tolerating PO  Consults: None    Treatments: surgery: RIGHT LUMBAR FOUR-FIVE REDO LAMINECTOMY, foremenotomy, microdisection   Discharge Exam: Blood pressure 146/75, pulse 83, temperature 97.6 F (36.4 C), temperature source Oral, resp. rate 20, SpO2 96.00%. Wound:c/d/i  Disposition: home     Medication List         albuterol-ipratropium 18-103 MCG/ACT inhaler  Commonly known as:  COMBIVENT  - Inhale 2 puffs into the lungs 2 (two) times daily. FOR COPD SYMPTOMS  -      budesonide-formoterol 160-4.5 MCG/ACT inhaler  Commonly known as:  SYMBICORT  Inhale 1 puff into the lungs 2 (two) times daily as needed (for shortness of breath).     cholecalciferol 1000 UNITS tablet  Commonly known as:  VITAMIN D  Take 2,000 Units by mouth daily.     cyclobenzaprine 10 MG tablet  Commonly known as:  FLEXERIL  Take 20 mg by mouth at bedtime.     dronabinol 5 MG capsule  Commonly known as:  MARINOL  Take 5 mg by mouth 4 (four) times daily.     EPIPEN 0.3 mg/0.3 mL Soaj injection  Generic drug:  EPINEPHrine  Inject 0.3 mg into the muscle once.     fexofenadine 180 MG tablet  Commonly known as:  ALLEGRA  Take 180 mg by mouth daily.     HYDROmorphone 4 MG tablet  Commonly known as:  DILAUDID  - Take 8 mg by mouth every 6 (six) hours as needed for severe pain. FOR PAIN  -      hydroxyurea 500 MG capsule  Commonly known as:  HYDREA  Take 500 mg by mouth every evening. May take with food to  minimize GI side effects.     levETIRAcetam 500 MG tablet  Commonly known as:  KEPPRA  Take 1,000 mg by mouth every 12 (twelve) hours.     lidocaine 2 % solution  Commonly known as:  XYLOCAINE  Use as directed 20 mLs in the mouth or throat as needed for mouth pain.     lidocaine 4 % cream  Commonly known as:  LMX  Apply 1 application topically daily as needed (for pain).     methocarbamol 500 MG tablet  Commonly known as:  ROBAXIN  Take 1,000 mg by mouth 2 (two) times daily.     morphine 30 MG tablet  Commonly known as:  MSIR  - Take 30 mg by mouth every 8 (eight) hours. FOR PAIN  -      polyethylene glycol packet  Commonly known as:  MIRALAX / GLYCOLAX  Take 17 g by mouth daily.     prochlorperazine 10 MG tablet  Commonly known as:  COMPAZINE  - Take 10 mg by mouth every 6 (six) hours as needed. FOR NAUSEA  -      Vitamin B-12 5000 MCG Subl  Place 5,000 mcg under the tongue daily.         Signed: Otilio Connors, MD 06/23/2013, 12:36 PM

## 2013-06-23 NOTE — Anesthesia Procedure Notes (Signed)
Procedure Name: Intubation Date/Time: 06/23/2013 10:05 AM Performed by: Maude Leriche D Pre-anesthesia Checklist: Patient identified, Emergency Drugs available, Suction available, Patient being monitored and Timeout performed Patient Re-evaluated:Patient Re-evaluated prior to inductionOxygen Delivery Method: Circle system utilized Preoxygenation: Pre-oxygenation with 100% oxygen Intubation Type: IV induction Ventilation: Mask ventilation without difficulty Laryngoscope Size: Miller and 2 Grade View: Grade I Tube type: Oral Tube size: 7.5 mm Number of attempts: 1 Airway Equipment and Method: Stylet Placement Confirmation: ETT inserted through vocal cords under direct vision,  positive ETCO2 and breath sounds checked- equal and bilateral Secured at: 22 cm Tube secured with: Tape Dental Injury: Teeth and Oropharynx as per pre-operative assessment

## 2013-06-24 ENCOUNTER — Encounter (HOSPITAL_COMMUNITY): Payer: Self-pay | Admitting: Neurosurgery

## 2013-06-24 NOTE — Progress Notes (Signed)
Pt given D/C instructions with verbal understanding. Pt D/C's home via wheelchair @ 1155 per MD order. Pt stable @ D/C and has no other needs. Holli Humbles, RN

## 2014-12-01 ENCOUNTER — Encounter (HOSPITAL_BASED_OUTPATIENT_CLINIC_OR_DEPARTMENT_OTHER): Payer: Self-pay | Admitting: Hematology & Oncology

## 2014-12-01 ENCOUNTER — Ambulatory Visit (FREE_STANDING_LABORATORY_FACILITY): Payer: Self-pay | Admitting: Hematology & Oncology

## 2014-12-01 VITALS — BP 125/61 | HR 68 | Temp 98.1°F | Ht 67.0 in | Wt 162.2 lb

## 2014-12-01 DIAGNOSIS — D75839 Thrombocytosis, unspecified: Secondary | ICD-10-CM | POA: Insufficient documentation

## 2014-12-01 DIAGNOSIS — C519 Malignant neoplasm of vulva, unspecified: Secondary | ICD-10-CM

## 2014-12-01 DIAGNOSIS — D473 Essential (hemorrhagic) thrombocythemia: Secondary | ICD-10-CM

## 2014-12-01 DIAGNOSIS — D869 Sarcoidosis, unspecified: Secondary | ICD-10-CM

## 2014-12-01 DIAGNOSIS — D649 Anemia, unspecified: Secondary | ICD-10-CM

## 2014-12-01 DIAGNOSIS — Z5181 Encounter for therapeutic drug level monitoring: Secondary | ICD-10-CM

## 2014-12-01 DIAGNOSIS — S0993XS Unspecified injury of face, sequela: Secondary | ICD-10-CM

## 2014-12-01 LAB — COMPREHENSIVE METABOLIC PANEL
ALT: 17 U/L (ref 0–55)
AST (SGOT): 16 U/L (ref 5–34)
Albumin/Globulin Ratio: 1.4 (ref 0.9–2.2)
Albumin: 3.8 g/dL (ref 3.5–5.0)
Alkaline Phosphatase: 118 U/L — ABNORMAL HIGH (ref 37–106)
BUN: 22 mg/dL — ABNORMAL HIGH (ref 7.0–19.0)
Bilirubin, Total: 0.2 mg/dL (ref 0.1–1.2)
CO2: 28 mEq/L (ref 21–30)
Calcium: 10.1 mg/dL (ref 8.5–10.5)
Chloride: 104 mEq/L (ref 100–111)
Creatinine: 0.8 mg/dL (ref 0.4–1.5)
Globulin: 2.7 g/dL (ref 2.0–3.7)
Glucose: 89 mg/dL (ref 70–100)
Potassium: 4.5 mEq/L (ref 3.5–5.3)
Protein, Total: 6.5 g/dL (ref 6.0–8.3)
Sodium: 139 mEq/L (ref 135–146)

## 2014-12-01 LAB — CBC AND DIFFERENTIAL
Basophils Absolute Automated: 0.03 10*3/uL (ref 0.00–0.20)
Basophils Automated: 0 %
Eosinophils Absolute Automated: 0.2 10*3/uL (ref 0.00–0.70)
Eosinophils Automated: 2 %
Hematocrit: 27.9 % — ABNORMAL LOW (ref 37.0–47.0)
Hgb: 9.2 g/dL — ABNORMAL LOW (ref 12.0–16.0)
Immature Granulocytes Absolute: 0.06 10*3/uL — ABNORMAL HIGH
Immature Granulocytes: 1 %
Lymphocytes Absolute Automated: 3.79 10*3/uL (ref 0.50–4.40)
Lymphocytes Automated: 42 %
MCH: 32.9 pg — ABNORMAL HIGH (ref 28.0–32.0)
MCHC: 33 g/dL (ref 32.0–36.0)
MCV: 99.6 fL (ref 80.0–100.0)
MPV: 9.9 fL (ref 9.4–12.3)
Monocytes Absolute Automated: 0.6 10*3/uL (ref 0.00–1.20)
Monocytes: 7 %
Neutrophils Absolute: 4.43 10*3/uL (ref 1.80–8.10)
Neutrophils: 49 %
Nucleated RBC: 0 /100 WBC (ref 0–1)
Platelets: 350 10*3/uL (ref 140–400)
RBC: 2.8 10*6/uL — ABNORMAL LOW (ref 4.20–5.40)
RDW: 15 % (ref 12–15)
WBC: 9.11 10*3/uL (ref 3.50–10.80)

## 2014-12-01 LAB — CALCIUM, IONIZED: Calcium, Ionized: 2.76 mEq/L — ABNORMAL HIGH (ref 2.30–2.58)

## 2014-12-01 LAB — HEMOLYSIS INDEX: Hemolysis Index: 1 (ref 0–18)

## 2014-12-01 LAB — GFR: EGFR: >60

## 2014-12-01 NOTE — Progress Notes (Deleted)
Subjective:       Patient ID: Janice Baird is a 64 y.o. female.    HPI  ***    {Common ambulatory SmartLinks:19316}    Review of Systems        Objective:    Physical Exam        Assessment:       ***      Plan:       ***

## 2014-12-01 NOTE — Progress Notes (Signed)
I had the pleasure of seeing your patient in our department today for an oncology consult visit. Below, you will find her initial consultation note.  If you have any questions, please feel free to contact me.  Again, thank you for allowing me to participate in this patients care.    HISTORY OF PRESENT ILLNESS  Janice Baird is 64 y.o. 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    Transfer patient from [place], recently moved in may 2016. She was initially diagnosed with sarcoidosis and thrombocytosis in April 2003.  She was undergoing preoperative blood work for surgery. Her platelet count at the time was found to be 900,000.  She had a bone marrow biopsy done, which revealed granulomatous infiltrates, felt to be c/w sarcoid. There were normal cytogenetics.  She had hypercalcemia as well at the time, and therefore was started on IV bisphosphonates and currently receives Zometa every 3 months.  She is tolerating it well.  She  is up to date with her dental care.   It was felt that the thrombocytosis was reactive from the sarcoid, no mention of myeloproliferative disorder seen on bone marrow, but she was  started on Hydrea and has been on that since and tolerating it well.        She also has a diagnosis of vulvar cancer diagnosed in  2002 when she started noticing a bloody, pustular vaginal discharge.   Apparently, she had radical vulvectomy and completed plastic surgery as of  November 2015.  She tells me she did not require chemotherapy or radiation.   She recently moved to the area and is living with her daughter, and she is  feeling relatively well today.  She says she is up to date with her  mammogram and she had a colonoscopy in September 2015.      Had DEXA 2014, on Zometa q 3 mos. Tolerating well.   Essential Thrombocytosis in 2002, was undergoing preop evaluation and found elevated plt count.   Has long bone pain due to sarcoid infiltration.   Has  seen primary doctor here    Sees pulmonologist here in next few weeks    Mammo: with in the year  Colonoscopy: Sept 2015    She had a fall while moving, did ot want to go to ER. Fell on the frame of a door and hit her left eye, still has some swelling  Allergies not on file  No current outpatient prescriptions on file.     No current facility-administered medications for this visit.     History reviewed. No pertinent past medical history.  History reviewed. No pertinent past surgical history.  History reviewed. No pertinent family history.  History     Social History   . Marital Status: Divorced     Spouse Name: N/A   . Number of Children: N/A   . Years of Education: N/A     Occupational History   . Not on file.     Social History Main Topics   . Smoking status: Current Every Day Smoker   . Smokeless tobacco: Not on file   . Alcohol Use: Not on file   . Drug Use: Not on file   . Sexual Activity: Not on file     Other Topics Concern   . Not on file     Social History Narrative   . No narrative on file     Review of Systems  Constitutional: Positive for fatigue. Negative for activity change and unexpected weight change.   HENT: Negative for mouth sores and nosebleeds.    Eyes: Negative for visual disturbance.   Respiratory: Negative for cough, chest tightness and shortness of breath.    Cardiovascular: Negative for chest pain and leg swelling.   Gastrointestinal: Negative for nausea, abdominal pain, blood in stool and abdominal distention.   Genitourinary: Negative for dysuria, frequency and hematuria.   Musculoskeletal: Positive for myalgias and arthralgias. Negative for back pain and gait problem.   Skin: Negative for color change, rash and wound.   Neurological: Negative for dizziness, facial asymmetry and headaches.   Hematological: Negative for adenopathy. Does not bruise/bleed easily.   Psychiatric/Behavioral: The patient is not nervous/anxious and is not hyperactive.        Objective:   There were no vitals  filed for this visit.  Physical Exam   Constitutional: She is oriented to person, place, and time. She appears well-developed and well-nourished.   HENT:   Head: Normocephalic and atraumatic.   Mouth/Throat: No oropharyngeal exudate.   Eyes: No scleral icterus.   Mild left periorbital swelling   Neck: Neck supple. No thyromegaly present.   Cardiovascular: Normal rate, regular rhythm and normal heart sounds.    Pulmonary/Chest: Effort normal and breath sounds normal.   Abdominal: Soft. Bowel sounds are normal. She exhibits no mass.   Musculoskeletal: Normal range of motion. She exhibits no edema or tenderness.   Lymphadenopathy:     She has no cervical adenopathy.   Neurological: She is alert and oriented to person, place, and time.   Skin: Skin is warm and dry. No rash noted. No erythema. No pallor.   Psychiatric: She has a normal mood and affect. Her behavior is normal. Thought content normal.     LAB DATA:   Results for Janice Baird (MRN [id number]) as of 12/01/2014 12:04   Ref. Range 12/01/2014 09:37   WBC Latest Ref Range: 3.50-10.80 x10 3/uL 9.11   Hemoglobin Latest Ref Range: 12.0-16.0 g/dL 9.2 (L)   Hematocrit Latest Ref Range: 37.0-47.0 % 27.9 (L)   Platelet Count Latest Ref Range: 140-400 x10 3/uL 350   RBC Latest Ref Range: 4.20-5.40 x10 6/uL 2.80 (L)   MCV Latest Ref Range: 80.0-100.0 fL 99.6   MCH, POC Latest Ref Range: 28.0-32.0 pg 32.9 (H)   MCHC Latest Ref Range: 32.0-36.0 g/dL 76.1   RDW Latest Ref Range: 12-15 % 15   MPV Latest Ref Range: 9.4-12.3 fL 9.9   Neutrophils Latest Ref Range: None % 49   Lymphocytes Automated Latest Ref Range: None % 42   Monocytes Latest Ref Range: None % 7   Eosinophils Automated Latest Ref Range: None % 2   Basophils Automated Latest Ref Range: None % 0   Immature Granulocyte Latest Ref Range: None % 1   Nucleated RBC Latest Ref Range: 0-1 /100 WBC 0   Neutrophils Absolute Latest Ref Range: 1.80-8.10 x10 3/uL 4.43   Abs Lymph Automated Latest Ref Range:  0.50-4.40 x10 3/uL 3.79   Abs Eos Automated Latest Ref Range: 0.00-0.70 x10 3/uL 0.20   Abs Mono Automated Latest Ref Range: 0.00-1.20 x10 3/uL 0.60   Absolute Baso Automated Latest Ref Range: 0.00-0.20 x10 3/uL 0.03   Absolute Immature Granulocyte Latest Ref Range: 0 x10 3/uL 0.06 (H)           Assessment & Plan:   This is a very pleasant 64 year old female who has multiple  medical  problems.  1.  Thrombocytosis felt to be reactive due to underlying sarcoidosis.   Today, her platelet count is 315.  She is doing well on Hydrea.  I would  like to check a JAK2 mutation.  If the JAK2 is negative and her platelet  count remains below 500, will consider tapering her off the Hydrea.  2.  In terms of her vulvar cancer, I need additional information. I have no pathology or staging reports.  Apparently, she had a radical vulvectomy, but did not have any radiation or  chemotherapy.  We will obtain records from her surgeon's office, Dr. Noland Fordyce  in [facility], as well as pathology, and any imaging studies that were  done.  She also needs to follow up with GYN as soon as possible.  3.  She is plugged in with a primary care physician here and the patient is  up to date with her mammogram and colonoscopy.  4.  In terms of her sarcoidosis, she will be seeing a pulmonologist here.   She may benefit from pulmonary function tests.  We will continue Zometa to  manage the hypercalcemia.  She will also be seeing a pain specialist for  help with management of pain issues related to lung and bone discomfort  from the sarcoidosis.      She had a mild macrocytic anemia.  Next time she comes in, we will check  iron studies, B12, TSH, SPEP, immunofixation and light chains.      [facility] [place]   Facial xray

## 2014-12-02 ENCOUNTER — Other Ambulatory Visit (HOSPITAL_BASED_OUTPATIENT_CLINIC_OR_DEPARTMENT_OTHER): Payer: Self-pay | Admitting: Hematology & Oncology

## 2014-12-04 ENCOUNTER — Ambulatory Visit
Admission: RE | Admit: 2014-12-04 | Discharge: 2014-12-04 | Disposition: A | Discharge status: Home / Self Care | Payer: Charity | Source: Ambulatory Visit | Attending: Hematology & Oncology | Admitting: Hematology & Oncology

## 2014-12-04 DIAGNOSIS — D869 Sarcoidosis, unspecified: Secondary | ICD-10-CM | POA: Insufficient documentation

## 2014-12-04 MED ORDER — HEPARIN SOD (PORK) LOCK FLUSH 100 UNIT/ML IV SOLN
5.0000 mL | INTRAVENOUS | Status: DC | PRN
Start: 2014-12-04 — End: 2014-12-14
  Administered 2014-12-04: 5 mL
  Filled 2014-12-04: qty 5

## 2014-12-04 MED ORDER — SODIUM CHLORIDE 0.9 % IV SOLN
20.0000 mL/h | INTRAVENOUS | Status: DC
Start: 2014-12-04 — End: 2014-12-14

## 2014-12-04 MED ORDER — DEXTROSE-SODIUM CHLORIDE 5-0.45 % IV SOLN
500.0000 mL/m2/h | Freq: Once | INTRAVENOUS | Status: AC
Start: 2014-12-08 — End: 2014-12-04
  Administered 2014-12-04: 500 mL/m2/h via INTRAVENOUS
  Filled 2014-12-04: qty 1000

## 2014-12-04 MED ORDER — SODIUM CHLORIDE 0.9 % IJ SOLN
5.0000 mL | INTRAMUSCULAR | Status: DC | PRN
Start: 2014-12-04 — End: 2014-12-14

## 2014-12-04 MED ORDER — ZOLEDRONIC ACID 4 MG/5ML IV CONC
4.0000 mg | Freq: Once | INTRAVENOUS | Status: AC
Start: 2014-12-04 — End: 2014-12-04
  Administered 2014-12-04: 4 mg via INTRAVENOUS
  Filled 2014-12-04: qty 5

## 2014-12-04 NOTE — Progress Notes (Signed)
A&Ox4, Ambulates with a cane, O2 NC 2 L, VSS, accessed port and brisk blood return noted, IVF and Zometa given, port de-accessed and flushed with NS and Heparin, escorted to clinic by daughter, d/c'd from clinic

## 2014-12-17 ENCOUNTER — Ambulatory Visit: Admission: RE | Admit: 2014-12-17 | Payer: Self-pay | Source: Ambulatory Visit | Admitting: Anesthesiology

## 2014-12-17 ENCOUNTER — Encounter: Admission: RE | Payer: Self-pay | Source: Ambulatory Visit

## 2014-12-17 SURGERY — AX PAIN CLINIC REQUEST
Anesthesia: Local

## 2014-12-22 ENCOUNTER — Encounter: Payer: Self-pay | Admitting: Anesthesiology

## 2014-12-22 ENCOUNTER — Ambulatory Visit: Payer: Charity | Admitting: Anesthesiology

## 2014-12-22 ENCOUNTER — Ambulatory Visit
Admission: RE | Admit: 2014-12-22 | Discharge: 2014-12-22 | Disposition: A | Discharge status: Home / Self Care | Payer: Charity | Source: Ambulatory Visit | Attending: Anesthesiology | Admitting: Anesthesiology

## 2014-12-22 ENCOUNTER — Encounter
Admission: RE | Disposition: A | Discharge status: Home / Self Care | Payer: Self-pay | Source: Ambulatory Visit | Attending: Anesthesiology

## 2014-12-22 SURGERY — AX PAIN CLINIC REQUEST
Anesthesia: Local

## 2014-12-23 ENCOUNTER — Encounter: Payer: Self-pay | Admitting: Anesthesiology

## 2014-12-23 NOTE — Anesthesia Preprocedure Evaluation (Signed)
Anesthesia Evaluation    AIRWAY           CARDIOVASCULAR           DENTAL         PULMONARY         OTHER FINDINGS    Alert, oriented x 3  Normal speech  Screening mental status exam normal  VSS  No acute respiratory distress    LE exam      Sensory:  Right- reduced sensation to LT along lateral thigh, anterolateral and anteromedial leg, entire foot except lateral foot.  Left side- sensation to light touch was intact throughout.      Motor:  Right - There was give way weakness in knee flexion and extension and also it movements very shaky.  Rest of the strength was 5/5.  Strength was normal in left lower extremity.     L4 patellar reflex;   Right- trace,  Left- 2+    S1 ankle reflex:      Right- 2+,  Left- 1+    Plantars:      Down going on both sides    Clonus:   Absent bilaterally.    ML tenderness:present    Paramedian tenderness:      Present on both sides  SI region tenderness:  Present on both sides  Myoifascial tenderness:      UE exam:    Sensory and motor exam was normal in both upper extremities except right wrist flexion, which was 5/5 but elicited shaking.                    Anesthesia Plan    ASA 3       (Consult only.)

## 2014-12-23 NOTE — Consults (Signed)
Service Date: 12/22/2014     Patient Type: A     CONSULTING PHYSICIAN: Tally Due MD     REFERRING PHYSICIAN:      December 23, 2014      REFERRED BY:  [facility]   [facility]  [address], [address]   [place], [place] [address]      Telephone Number:  [phone] and   Fax: [phone]       INDICATION FOR REFERRAL:  New patient referral for pain management.     HISTORY OF PRESENT ILLNESS:  Janice Baird is a very pleasant, 64 year old woman.  She has been suffering  from for chronic pain since about 2000.  She used to live in [place]  and had had a pain management physician there who took care of her, who was  in charge of her medical management as well as procedures.  Recently she  moved from [place] to [place] and is looking for a pain practice to  take over her medications, as well as to perform procedures for her back  pain.  She has transferred her care from [facility] to  [place].       Janice Baird was diagnosed with sarcoidosis with bone marrow infiltration  sometime in 2000.  Since then, she has been experiencing pain in her bones.   Since then she has been taking Marinol 5 mg and Dilaudid 4 mg tablets four  tablets a day.  In 2002, she started taking immediate release morphine 30  mg every 8 hours.  Her pain was manageable with these  medications and her doses have not changed.  She developed back pain in  2011.  She denied any inciting event like fall or back injury.  She had  epidural steroid injections.  These injections did not work and she had her  first back surgery in 2012.  She does not think that her pain improved  after the surgery and she continued to get epidural steroid injections.   Eventually, she had a second surgery in January 2015.  She said her pain  got better until June of 2015 and then gradually her pain has come back and  has gotten worse again.     Her current pain is located in her right lower back.  It  radiates to her  right buttock, posterior and lateral thigh and posterior and lateral leg.   She said in last 6 months of 2015, she had 3 epidural steroid injections.   In between January and July of this year, she had 2 epidural steroid  injections.  Her last epidural steroid injection was either in March or  April of 2016.  She did not think epidural steroid injections helped her  really well, maybe for a couple of months, and she relies mostly on  medications.     Currently, she describes her pain as constant cramping, numbing like  electricity, burning, stinging and aching.  Severity of her pain was 8/10.   The best it can come down to is 6/10 with medication and worse pain is  10/10.  Pain is alleviated to some extent by slipping pillows under her  legs.  Pain gets worse with standing and walking a lot.     She reported feeling numb in her right calf and her big toe and second toe  of her right foot.  She reported feeling weakness in her right leg.   She  reported buckling of her right knee and tendency to drag her right foot.     She reported urinary hesitation intermittently, stress incontinence and  urinary frequency.  These symptoms are not progressing.  They are chronic  and stable.       Recently, she has also developed pain in her neck and it radiates up in her  head, and lately, it has started radiating on both sides to her shoulders,  especially on the right side.     Her doctor has tried several medications, but she was unable to take  Neurontin and Lyrica.  Her doctor has also discussed the possibility of  advanced pain therapies like intrathecal pump and stimulator,  but  eventually, for some reason, it was not tried.     She also tried the water therapy.  Her ongoing pain is affecting her sleep,  appetite, physical activity, and concentration.  Her routine involves  mostly walking and stretches.     She will be seeing Dr. Josie Dixon hematologist/oncologist for sarcoidosis,  but since Dr. Maisie Fus  would be unable to prescribe her opioids and Marinol,  she is looking for pain management practice to take over her medications.     PAST MEDICAL HISTORY:  Significant for   1.  Chronic smoker.  2.  Chronic obstructive pulmonary disease on home oxygen and helium.  3.  Sarcoidosis in bone marrow  4.  History of ... cancer.  5.  Gastroesophageal reflux disease and history of nephritis.     PAST SURGICAL HISTORY:  Significant for   1.  Tonsillectomy in 1972.  2.  Breast surgery in 1972.  3.  Hysterectomy in 1978.  4.  D and C in 1974.  5.  Right patellar surgery.  6.  Bladder suspension in 1989.  7.  Cholecystectomy in 2000.  8.  Bone marrow biopsy in 2001.  9.  Thyroid surgery in 2003.  10.  Salpingo-oophorectomy in 2002.  11.  Hernia repair in 2004    12. ... reconstruction in 2012.  13.  Several epidural steroid injections.     ALLERGIES:  SULFA ANTIBIOTICS CAUSE SHORTNESS OF BREATH; HYDROGEN PEROXIDE; BEE VENOM;  CODEINE CAUSES NAUSEA AND VOMITING; GABAPENTIN CAUSES NAUSEA AND VOMITING;  GLYCERIN; IODINE AND CONTRAST DYE CAUSE HIVES; NONSTEROIDAL  ANTI-INFLAMMATORIES CAUSE REFLUX DISEASE; OLOPATADINE CAUSES ITCHING;  LYRICA CAUSES NAUSEA AND VOMITING; STATINS CAUSE RADIATING PAIN; ADHESIVE  DRESSINGS; LATEX CAUSES RASH; RISEDRONATE CAUSES SKIN RASH.       CURRENT MEDICATIONS:    Include   1.  Albuterol nebulizer.    2.  Symbicort.   3.  Keppra 500 mg.  4,  Effexor.   5.  Marinol 500 mg.  6.  Dilaudid 4 mg four times a day.  7.  MS IR 30 mg every 8 hours.  8.  OxyContin 30 mg 3 times a day.  She has been taking OxyContin since  January of this year.  9.   Allegra.   10.  Phenergan.  11.  Hydroxyurea 500 mg for sarcoidosis.  12.  Compazine 10 mg.  13.  Lasix 20 mg.  14. Vitamin B12.  15.  MiraLax.  16.  Glucosamine chondroitin.   17.  Local application of Voltaren gel and lidocaine cream.  18.  Muscle relaxants, including Flexeril 10 mg.  19.  Robaxin.  20.   Vitamin D.   Details of the doses and regimen are in Epic,  which has been reviewed.  The  patient  also had a list of her medications and regimen, which I have  reviewed.     SOCIAL HISTORY:  The patient is a chronic smoker and she thinks she started when she was 64  years old.  She used to smoke more, but lately she has been smoking 1 to 2  cigarettes per day.     PHYSICAL EXAMINATION:  GENERAL:  Mr. Willaims was alert, awake, oriented x3.  There was no acute  distress.  She is 5 feet 7 inches tall and weighs 160 pounds, giving BMI of  25.1.   She was on oxygen.  VITAL SIGNS:  Temperature was 36.8, heart rate was 77 beats per minute,  respirations 20 per minute, blood pressure was 140/65 and oxygen saturation  was 99% on room air.  HEART:  Regular.  LUNGS:  Showed no acute respiratory distress.  LOWER EXTREMITIES:  Sensation to light touch was intact throughout lower  extremities bilaterally.  Strength was 5/5 throughout lower extremities  bilaterally.  Patellar and ankle reflexes were absent bilaterally.   BACK:  There was midline and paramedian tenderness, as well as sacroiliac  region tenderness along the lumbosacral spine.  UPPER EXTREMITIES:  Sensation to light touch was intact throughout upper  extremities bilaterally.  Strength was 5/5 bilaterally.     LABORATORY DATA:  MRI of lumbosacral spine performed in December of 2014.  It has been  reported as follows:  1.  L4-L5, there is severe right facet arthropathy and mild broad-based  disk bulge with severe right foraminal stenosis.  There is no significant  interval change compared to January 02, 2011.  2.  At L5-S1, there is mild broad-based disk bulge with moderate left facet  arthropathy with severe left foraminal stenosis.  There is no significant  interval change compared to January 02, 2011.  3.  There is diffuse heterogeneous marrow signal throughout the lumbosacral  spine which is similar in appearance to the prior examination.  This can be  caused by marrow infiltrative process like multiple myeloma.  The most  common  causes include anemia, smoking, obesity or advanced age.  This  appearance may also be secondary to patient's history of sarcoidosis.  3.  She has had an MRI of cervical spine, which was performed on February 10, 2012.  It has been reported as mild central and moderate left foraminal  stenosis at C2-C3, mild central and left foraminal stenosis at C3-C4.    4.  Bilateral mild to moderate bilateral foraminal stenosis at C4-C5, which  is worse on the left.  5.  The most severe disease is at C5-C6 with moderate central and bilateral  foraminal stenosis, which is worse on the right.  6.  Mild central and mild to moderate bilateral foraminal stenosis at  C6-C7.     ASSESSMENT AND PLAN:  1.  Chronic pain in the long bones secondary to marrow infiltration  from sarcoidosis.  2.  Chronic low back pain with right lower extremity radiation evidence.   She is status post lumbar spine surgery x2.     I explained to the patient right from the beginning, that in this office I  do not have capacity to do medical management, so she will have to find a  prescriber who will accept Medicaid patients.  Other option would be to go  back to her physicians in [place] and use a self-pay options.  The  patient told me that she is originally from the [facility]  area in [place] and she might be able to get medicine from her old primary care  physician there who knows her.  In the meantime, I have given her a list of  several doctors who would be able to do medication management for her.     As far as epidural steroid injections, they do not seem to provide her  long-term benefit and I do not think is a good option to continue doing it,  especially when she already has osteoporosis.  I have given her a referral  for evaluation for advanced pain therapies.  She expressed understanding.     In the worst possible situation, if she does not find anybody to do her  injections, I told her I would be happy to do it, but I would like to  have  a new MRI after her most recent surgery.  She expressed understanding.              D:  12/23/2014 18:12 PM by Dr. [place] ([id number])  T:  12/23/2014 19:51 PM by NTS      (Conf: [id number]) (Doc ID: [id number])

## 2015-01-13 ENCOUNTER — Ambulatory Visit (HOSPITAL_BASED_OUTPATIENT_CLINIC_OR_DEPARTMENT_OTHER): Payer: Charity | Admitting: Hematology & Oncology

## 2015-02-12 ENCOUNTER — Ambulatory Visit (INDEPENDENT_AMBULATORY_CARE_PROVIDER_SITE_OTHER): Payer: Charity | Admitting: Hematology & Oncology

## 2015-02-12 ENCOUNTER — Ambulatory Visit
Admission: RE | Admit: 2015-02-12 | Discharge: 2015-02-12 | Disposition: A | Discharge status: Home / Self Care | Payer: Self-pay | Source: Ambulatory Visit | Attending: Hematology & Oncology | Admitting: Hematology & Oncology

## 2015-02-12 ENCOUNTER — Encounter (INDEPENDENT_AMBULATORY_CARE_PROVIDER_SITE_OTHER): Payer: Self-pay | Admitting: Hematology & Oncology

## 2015-02-12 ENCOUNTER — Telehealth (INDEPENDENT_AMBULATORY_CARE_PROVIDER_SITE_OTHER): Payer: Self-pay

## 2015-02-12 VITALS — BP 145/83 | HR 87 | Temp 98.6°F | Wt 156.4 lb

## 2015-02-12 DIAGNOSIS — D75839 Thrombocytosis, unspecified: Secondary | ICD-10-CM

## 2015-02-12 DIAGNOSIS — D649 Anemia, unspecified: Secondary | ICD-10-CM | POA: Insufficient documentation

## 2015-02-12 DIAGNOSIS — Z5181 Encounter for therapeutic drug level monitoring: Secondary | ICD-10-CM

## 2015-02-12 DIAGNOSIS — R110 Nausea: Secondary | ICD-10-CM

## 2015-02-12 DIAGNOSIS — D473 Essential (hemorrhagic) thrombocythemia: Secondary | ICD-10-CM

## 2015-02-12 LAB — FERRITIN: Ferritin: 29.55 ng/mL (ref 4.60–204.00)

## 2015-02-12 LAB — COMPREHENSIVE METABOLIC PANEL
ALT: 10 U/L (ref 0–55)
AST (SGOT): 15 U/L (ref 5–34)
Albumin/Globulin Ratio: 1.6 (ref 0.9–2.2)
Albumin: 3.9 g/dL (ref 3.5–5.0)
Alkaline Phosphatase: 109 U/L — ABNORMAL HIGH (ref 37–106)
BUN: 11 mg/dL (ref 7.0–19.0)
Bilirubin, Total: 0.1 mg/dL (ref 0.1–1.2)
CO2: 26 mEq/L (ref 21–30)
Calcium: 10.4 mg/dL (ref 8.5–10.5)
Chloride: 106 mEq/L (ref 100–111)
Creatinine: 0.9 mg/dL (ref 0.4–1.5)
Globulin: 2.5 g/dL (ref 2.0–3.7)
Glucose: 106 mg/dL — ABNORMAL HIGH (ref 70–100)
Potassium: 4.3 mEq/L (ref 3.5–5.3)
Protein, Total: 6.4 g/dL (ref 6.0–8.3)
Sodium: 140 mEq/L (ref 135–146)

## 2015-02-12 LAB — IGG: Immunoglobulin G: 651 mg/dL (ref 540–1822)

## 2015-02-12 LAB — CBC AND DIFFERENTIAL
Basophils Absolute Automated: 0.05 10*3/uL (ref 0.00–0.20)
Basophils Automated: 0 %
Eosinophils Absolute Automated: 0.31 10*3/uL (ref 0.00–0.70)
Eosinophils Automated: 3 %
Hematocrit: 29.4 % — ABNORMAL LOW (ref 37.0–47.0)
Hgb: 9.1 g/dL — ABNORMAL LOW (ref 12.0–16.0)
Immature Granulocytes Absolute: 0.04 10*3/uL
Immature Granulocytes: 0 %
Lymphocytes Absolute Automated: 2.88 10*3/uL (ref 0.50–4.40)
Lymphocytes Automated: 31 %
MCH: 30.2 pg (ref 28.0–32.0)
MCHC: 31 g/dL — ABNORMAL LOW (ref 32.0–36.0)
MCV: 97.7 fL (ref 80.0–100.0)
MPV: 11.6 fL (ref 9.4–12.3)
Monocytes Absolute Automated: 0.66 10*3/uL (ref 0.00–1.20)
Monocytes: 7 %
Neutrophils Absolute: 5.29 10*3/uL (ref 1.80–8.10)
Neutrophils: 57 %
Nucleated RBC: 0 /100 WBC (ref 0–1)
Platelets: 436 10*3/uL — ABNORMAL HIGH (ref 140–400)
RBC: 3.01 10*6/uL — ABNORMAL LOW (ref 4.20–5.40)
RDW: 15 % (ref 12–15)
WBC: 9.23 10*3/uL (ref 3.50–10.80)

## 2015-02-12 LAB — HEMOLYSIS INDEX: Hemolysis Index: 4 (ref 0–18)

## 2015-02-12 LAB — POCT AMB CBC W/ AUTO DIFF
Granulocytes Abs: 5.8
Granulocytes: 57.5
Hematocrit POCT: 27
Hgb POCT: 8.9 g/dL — AB (ref 12–15.5)
Lymphocytes Absolute POCT: 3.5
Lymphocytes Automated: 34.2
MCH, POC: 30.6
MCHC: 32.8
MCV: 93.1
MPV: 8.9
Monocytes Absolute POCT: 0.8
Monocytes: 8.3
Platelet Count POCT: 381
RBC: 2.9
RDW POCT: 16.8
WBC: 10.1

## 2015-02-12 LAB — IRON PROFILE
Iron Saturation: 15 % (ref 15–50)
Iron: 50 ug/dL (ref 40–145)
TIBC: 326 ug/dL (ref 265–497)
UIBC: 276 ug/dL (ref 126–382)

## 2015-02-12 LAB — TSH: TSH: 1.33 u[IU]/mL (ref 0.35–4.94)

## 2015-02-12 LAB — FOLATE: Folate: 8.2 ng/mL

## 2015-02-12 LAB — GFR: EGFR: >60

## 2015-02-12 LAB — IGA: Immunoglobulin A: 108 mg/dL (ref 69–517)

## 2015-02-12 LAB — VITAMIN B12: Vitamin B-12: 675 pg/mL (ref 211–911)

## 2015-02-12 MED ORDER — DRONABINOL 5 MG PO CAPS
5.0000 mg | ORAL_CAPSULE | Freq: Four times a day (QID) | ORAL | Status: AC
Start: 2015-02-12 — End: ?

## 2015-02-12 MED ORDER — HYDROXYUREA 500 MG PO CAPS
500.0000 mg | ORAL_CAPSULE | Freq: Every day | ORAL | Status: AC
Start: 2015-02-12 — End: ?

## 2015-02-12 NOTE — Progress Notes (Signed)
I had the pleasure of seeing your patient in our department today for an oncology consult visit. Below, you will find her initial consultation note.  If you have any questions, please feel free to contact me.  Again, thank you for allowing me to participate in this patients care.    HISTORY OF PRESENT ILLNESS  Janice Baird is 64 y.o. 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    Transfer patient from [place], recently moved in may 2016. She was initially diagnosed with sarcoidosis and thrombocytosis in April 2003.  She was undergoing preoperative blood work for surgery. Her platelet count at the time was found to be 900,000.  She had a bone marrow biopsy done, which revealed granulomatous infiltrates, felt to be c/w sarcoid. There were normal cytogenetics.  She had hypercalcemia as well at the time, and therefore was started on IV bisphosphonates and currently receives Zometa every 3 months.  She is tolerating it well.  She  is up to date with her dental care.   It was felt that the thrombocytosis was reactive from the sarcoid, no mention of myeloproliferative disorder seen on bone marrow, but she was  started on Hydrea and has been on that since and tolerating it well.        She also has a diagnosis of vulvar cancer diagnosed in  2002 when she started noticing a bloody, pustular vaginal discharge.   Apparently, she had radical vulvectomy and completed plastic surgery as of  November 2015.  She tells me she did not require chemotherapy or radiation.   She recently moved to the area and is living with her daughter, and she is  feeling relatively well today.  She says she is up to date with her  mammogram and she had a colonoscopy in September 2015.      Had DEXA 2014, on Zometa q 3 mos. Tolerating well.   Essential Thrombocytosis in 2002, was undergoing preop evaluation and found elevated plt count.   Has long bone pain due to sarcoid infiltration.   Has  seen primary doctor here    Sees pulmonologist here in next few weeks    Mammo: with in the year  Colonoscopy: Sept 2015    No new complaints  Notes that the only medication that controls her nausea is marinol  Chronic arthralgias or myalgias  Allergies   Allergen Reactions   . Sulfa Antibiotics Shortness Of Breath   . Hydrogen Peroxide Other (See Comments)   . Bee Venom    . Codeine Nausea And Vomiting   . Gabapentin Nausea And Vomiting   . Glycerin    . Iodine    . Iohexol Hives      Code: HIVES, Desc: IVP DYE   . Nsaids      reflux   . Olopatadine Itching     Itchy watery eyes   . Pregabalin Nausea And Vomiting   . Statins      Radiating pain   . Wound Dressing Adhesive Hives     "REMOVES SKIN"   . Latex Rash   . Risedronate Rash     Current Outpatient Prescriptions   Medication Sig Dispense Refill   . albuterol (PROVENTIL) (2.5 MG/3ML) 0.083% nebulizer solution      . budesonide-formoterol (SYMBICORT) 160-4.5 MCG/ACT inhaler Inhale into the lungs.     . Cholecalciferol (VITAMIN D) 1000 UNIT tablet Take by mouth.     . Cyanocobalamin (VITAMIN B-12)  5000 MCG SL Tab Place under the tongue.     . diclofenac sodium (VOLTAREN) 1 % Gel topical gel Apply 2 mg to upper extremities; Apply 4 mg to upper extremity parts     . dronabinol (MARINOL) 5 MG capsule Take by mouth.     . EPINEPHrine (EPIPEN) 0.3 MG/0.3ML Solution Auto-injector Inject into the muscle.     . furosemide (LASIX) 20 MG tablet Take 10 mg by mouth.     Marland Kitchen glucosamine-chondroitin 500-400 MG tablet Take 1 tablet by mouth.     Marland Kitchen HYDROmorphone (DILAUDID) 4 MG tablet Take by mouth.     . hydroxyurea (HYDREA) 500 MG capsule Take by mouth.     . levETIRAcetam (KEPPRA) 500 MG tablet Take by mouth.     . lidocaine (LMX) 4 % cream Apply topically.     . Lidocaine HCl 3 % Cream APPLY 6" LINE OF CREAM 4 TIMES A DAY     . methocarbamol (ROBAXIN) 500 MG tablet Take by mouth.     . methocarbamol (ROBAXIN) 750 MG tablet TAKE ONE TABLET FIVE TIMES DAILY     . morphine  (MSIR) 30 MG tablet Take by mouth.     . oxyCODONE (OXYCONTIN) 20 MG 12 hr tablet      . OXYGEN-HELIUM IN Inhale 1.5 L/min into the lungs.     . polyethylene glycol (MIRALAX) packet Take by mouth.     . venlafaxine (EFFEXOR-XR) 75 MG 24 hr capsule Take 75 mg by mouth.     . Cholecalciferol 1000 UNITS capsule Take 1,000 Int'l Units by mouth.     . cyclobenzaprine (FLEXERIL) 10 MG tablet Take by mouth.     . fexofenadine (ALLEGRA) 180 MG tablet Take by mouth.     . lidocaine viscous (XYLOCAINE) 2 % solution Use as directed 20 mLs in the mouth or throat as needed for mouth pain.     Marland Kitchen prochlorperazine (COMPAZINE) 10 MG tablet Take by mouth.       No current facility-administered medications for this visit.     Past Medical History   Diagnosis Date   . Chronic obstructive pulmonary disease      on home oxygen   . Sarcoidosis, erythrodermic type    . Malignant neoplasm      vulvar cancer.   . Gastroesophageal reflux disease    . Nephritis      Past Surgical History   Procedure Laterality Date   . Tonsillectomy  1972   . Breast surgery  1972   . Hysterectomy  1978     partial   . Dilation and curettage of uterus  1974   . Patella surgery Right    . Bladder suspension  1989   . Cholecystectomy  2000   . Bone marrow biopsy  2001   . Thyroid surgery  2003   . Salpingo oophorectomy  2002   . Hernia repair  2004   . Vulvar reconstruction  2012   . Epidural injections       History reviewed. No pertinent family history.  Social History     Social History   . Marital Status: Divorced     Spouse Name: N/A   . Number of Children: N/A   . Years of Education: N/A     Occupational History   . Not on file.     Social History Main Topics   . Smoking status: Current Every Day Smoker   . Smokeless tobacco: Not on  file   . Alcohol Use: No   . Drug Use: No   . Sexual Activity: Not on file     Other Topics Concern   . Not on file     Social History Narrative     Review of Systems   Constitutional: Positive for fatigue. Negative for activity  change and unexpected weight change.   HENT: Negative for mouth sores and nosebleeds.    Eyes: Negative for visual disturbance.   Respiratory: Negative for cough, chest tightness and shortness of breath.    Cardiovascular: Negative for chest pain and leg swelling.   Gastrointestinal: Negative for nausea, abdominal pain, blood in stool and abdominal distention.   Genitourinary: Negative for dysuria, frequency and hematuria.   Musculoskeletal: Positive for myalgias and arthralgias. Negative for back pain and gait problem.   Skin: Negative for color change, rash and wound.   Neurological: Negative for dizziness, facial asymmetry and headaches.   Hematological: Negative for adenopathy. Does not bruise/bleed easily.   Psychiatric/Behavioral: The patient is not nervous/anxious and is not hyperactive.        Objective:     Filed Vitals:    02/12/15 1139   BP: 145/83   Pulse: 87   Temp: 98.6 F (37 C)     Physical Exam   Constitutional: She is oriented to person, place, and time. She appears well-developed and well-nourished.   HENT:   Head: Normocephalic and atraumatic.   Mouth/Throat: No oropharyngeal exudate.   Eyes: No scleral icterus.   Mild left periorbital swelling   Neck: Neck supple. No thyromegaly present.   Cardiovascular: Normal rate, regular rhythm and normal heart sounds.    Pulmonary/Chest: Effort normal and breath sounds normal.   Abdominal: Soft. Bowel sounds are normal. She exhibits no mass.   Musculoskeletal: Normal range of motion. She exhibits no edema or tenderness.   Lymphadenopathy:     She has no cervical adenopathy.   Neurological: She is alert and oriented to person, place, and time.   Skin: Skin is warm and dry. No rash noted. No erythema. No pallor.   Psychiatric: She has a normal mood and affect. Her behavior is normal. Thought content normal.     LAB DATA:   Results for AYDE, RECORD (MRN [id number]) as of 02/12/2015 14:50   Ref. Range 02/12/2015 11:29   WBC Unknown 10.1   Hgb POCT  Latest Ref Range: 12-15.5 g/dL 8.9 (A)   Hematocrit Unknown 27.0   Platelet Count, POC Unknown 381   RBC Unknown 2.90   MCV Unknown 93.1   MCH, POC Unknown 30.6   MCHC Unknown 32.8   RDW-CV Unknown 16.8   MPV Unknown 8.9   Lymphocytes Automated Unknown 34.2   Monocytes Unknown 8.3   Lymphs Abs Unknown 3.5   GRANS Absolute Unknown 5.8   Monos Abs Unknown 0.8   Grans Unknown 57.5       Assessment & Plan:   This is a very pleasant 64 year old female who has multiple medical  problems.  1.  Thrombocytosis was felt to be reactive due to underlying sarcoidosis. It was over 900k at initial diagnosis by her oncologist.  Today, her platelet count is 315.  She is doing well on Hydrea.  I would  like to check a JAK2 mutation.  If the JAK2 is negative and her platelet  count remains below 500, will consider tapering her off the Hydrea. Rec she take 500mg  every 3rd day    Anemia:  check iron/b12/CMP/plasma cell dyscrasia w/u    2.  In terms of her vulvar cancer, I need additional information. I have no pathology or staging reports.  Apparently, she had a radical vulvectomy, but did not have any radiation or  chemotherapy.  Requested records from her surgeon's office, Dr. Noland Fordyce  in [facility], as well as pathology, and any imaging studies that were  done.  She went to [place] a few weeks ago and saw her surgeon    3.  She is plugged in with a primary care physician here and the patient is  up to date with her mammogram and colonoscopy.    4.  In terms of her sarcoidosis, she will be seeing a pulmonologist here. She hasnt yet due to the fact that her Medicaid is not approved  She may benefit from pulmonary function tests.      I will continue Zometa to  manage the hypercalcemia.      5. She will also be seeing a pain specialist for  help with management of pain issues related to lung and bone discomfort  from the sarcoidosis.      She had a mild macrocytic anemia.  Next time she comes in, we will check  iron studies, B12, TSH, SPEP,  immunofixation and light chains.      Surgeon:[facility] Noland Fordyce, Remi Deter

## 2015-02-12 NOTE — Telephone Encounter (Signed)
Spoke to patient's daughter. Instruct daughter to call [facility] for zometa appointment.

## 2015-02-15 LAB — PROTEIN ELECTROPHORESIS, SERUM
Albumin %: 52.3 % (ref 46.6–62.6)
Albumin, Synovial: 3.3 g/dL — ABNORMAL LOW (ref 3.4–4.8)
Alpha-1 Glob %: 4.5 % — ABNORMAL HIGH (ref 1.7–4.1)
Alpha-1 Globulin: 0.3 g/dL (ref 0.1–0.4)
Alpha-2 Glob %: 16.3 % — ABNORMAL HIGH (ref 8.9–14.9)
Alpha-2 Globulin: 1 g/dL (ref 0.8–1.2)
Beta Glob %: 15.3 % (ref 10.9–18.9)
Beta Globulin: 1 g/dL (ref 0.6–1.2)
Gamma Globulin %: 11.6 % (ref 9.8–24.4)
Gamma Globulin: 0.7 g/dL (ref 0.6–1.7)
Protein, Total: 6.4 g/dL (ref 6.0–8.3)

## 2015-02-15 LAB — KAP/LMBDA LGHT CH, FR W/RATIO, RFX IMFIX
Free Kappa, Serum: 12.5 mg/L (ref 3.3–19.4)
Free Kappa/Lambda Ratio: 0.91 (ref 0.26–1.65)
Free Lambda, Serum: 13.8 mg/L (ref 5.7–26.3)

## 2015-02-15 LAB — IMMUNOFIXATION ELECTROPHORESIS

## 2015-02-16 LAB — IFE REVIEW, SERUM

## 2015-02-16 LAB — SERUM PROTEIN ELECTROPHORESIS REVIEW

## 2015-03-10 ENCOUNTER — Encounter: Payer: Self-pay | Admitting: Anesthesiology

## 2015-03-12 ENCOUNTER — Ambulatory Visit: Admission: RE | Admit: 2015-03-12 | Payer: Charity | Source: Ambulatory Visit

## 2015-03-26 ENCOUNTER — Ambulatory Visit: Admission: RE | Admit: 2015-03-26 | Payer: Charity | Source: Ambulatory Visit

## 2015-06-24 ENCOUNTER — Ambulatory Visit: Payer: Charity

## 2015-07-05 IMAGING — CR DG CHEST 2V
2 series · 2 of 2 positions shown · non-contrast
Comparison: 12/07/2009

CLINICAL DATA: Preop evaluation, history COPD

EXAM:
CHEST  2 VIEW

[w chest pa]
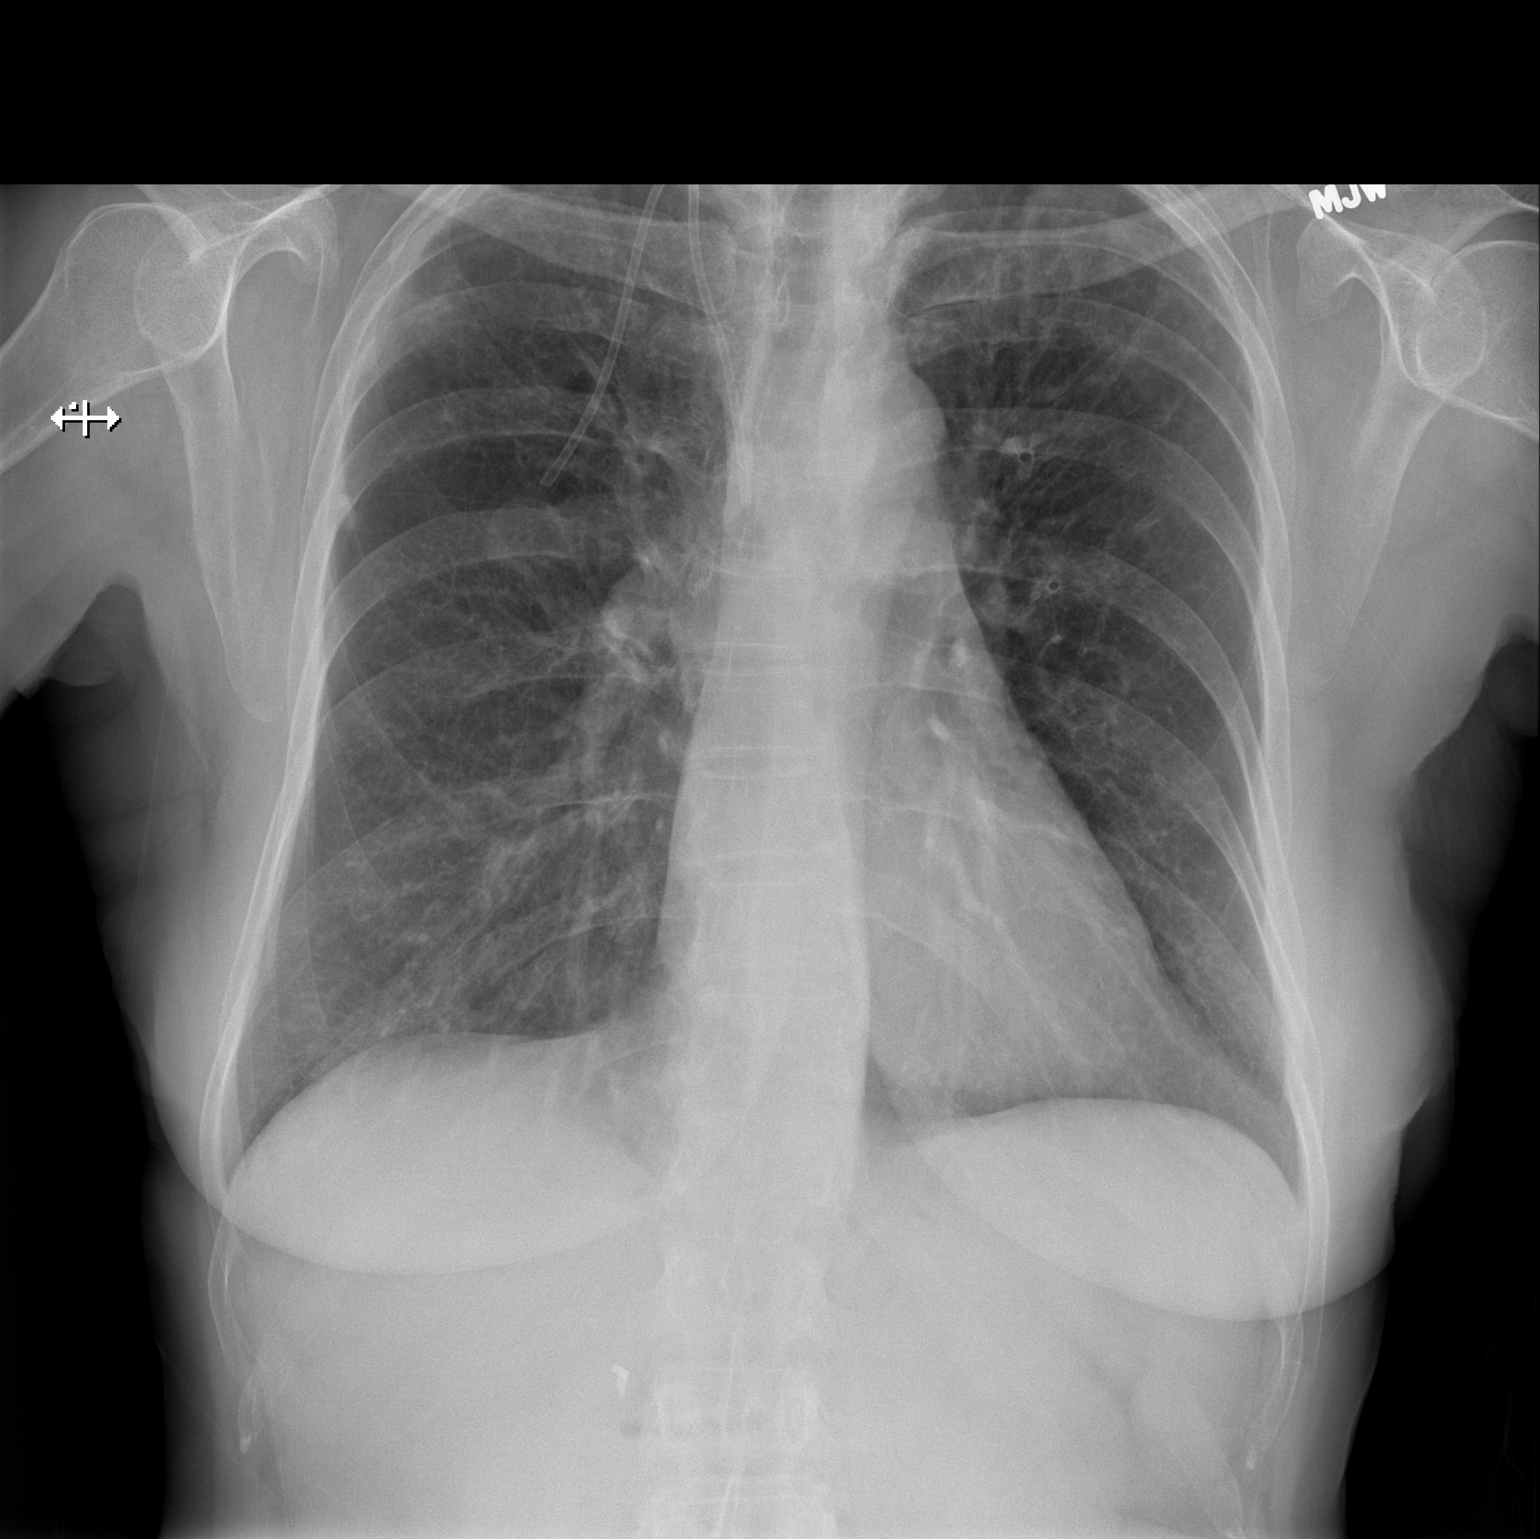

[w chest lat]
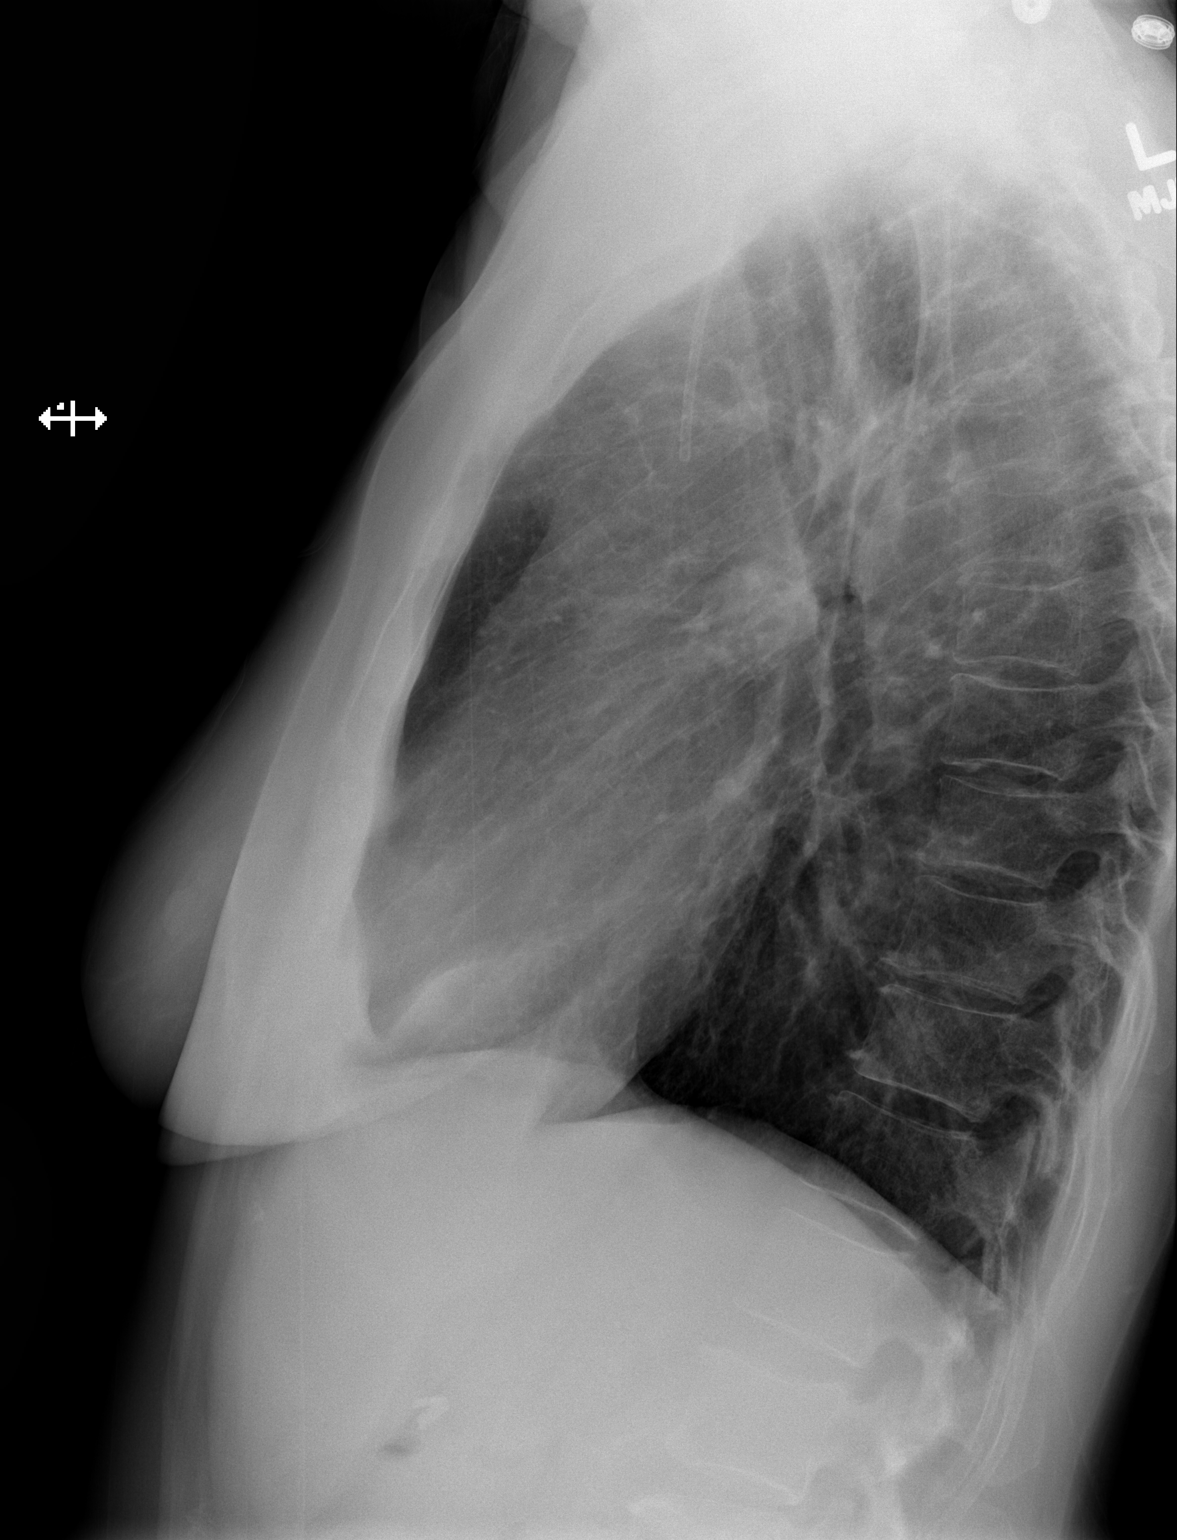

[2 of 2 positions shown; findings below may reference images not displayed]

FINDINGS: The cardiac silhouette and mediastinal contours are unremarkable. A
right-sided porta catheter is appreciated with tip projecting in the
region of the superior vena cava. No focal regions of consolidation
or focal infiltrates appreciated. Persistent prominence of the
interstitial markings is appreciated stable. The osseous structures
unremarkable. There is mild hyperinflation.
IMPRESSION: Chronic bronchitic changes without evidence of acute cardiopulmonary
disease. Chronic bronchitic changes.
